# Patient Record
Sex: Male | Born: 1975 | Race: Black or African American | Hispanic: No | Marital: Single | Smoking: Current every day smoker
Health system: Southern US, Community
[De-identification: ages and names within clinical notes are randomized; demographics above are authoritative.]

## PROBLEM LIST (undated history)

## (undated) DIAGNOSIS — K859 Acute pancreatitis without necrosis or infection, unspecified: Secondary | ICD-10-CM

## (undated) DIAGNOSIS — K8591 Acute pancreatitis with uninfected necrosis, unspecified: Secondary | ICD-10-CM

## (undated) HISTORY — PX: NO PAST SURGERIES: SHX2092

---

## 1998-06-20 ENCOUNTER — Emergency Department (HOSPITAL_COMMUNITY): Admission: EM | Admit: 1998-06-20 | Discharge: 1998-06-20 | Payer: Self-pay | Admitting: Emergency Medicine

## 1998-06-20 ENCOUNTER — Encounter: Payer: Self-pay | Admitting: Emergency Medicine

## 2000-08-18 ENCOUNTER — Encounter: Payer: Self-pay | Admitting: *Deleted

## 2000-08-18 ENCOUNTER — Emergency Department (HOSPITAL_COMMUNITY): Admission: EM | Admit: 2000-08-18 | Discharge: 2000-08-18 | Payer: Self-pay | Admitting: Podiatry

## 2001-03-05 ENCOUNTER — Emergency Department (HOSPITAL_COMMUNITY): Admission: EM | Admit: 2001-03-05 | Discharge: 2001-03-05 | Payer: Self-pay

## 2001-03-07 ENCOUNTER — Emergency Department (HOSPITAL_COMMUNITY): Admission: EM | Admit: 2001-03-07 | Discharge: 2001-03-08 | Payer: Self-pay | Admitting: *Deleted

## 2001-03-08 ENCOUNTER — Emergency Department (HOSPITAL_COMMUNITY): Admission: EM | Admit: 2001-03-08 | Discharge: 2001-03-08 | Payer: Self-pay | Admitting: Emergency Medicine

## 2001-03-08 ENCOUNTER — Encounter: Payer: Self-pay | Admitting: Emergency Medicine

## 2001-03-30 ENCOUNTER — Encounter: Payer: Self-pay | Admitting: Emergency Medicine

## 2001-03-30 ENCOUNTER — Emergency Department (HOSPITAL_COMMUNITY): Admission: EM | Admit: 2001-03-30 | Discharge: 2001-03-30 | Payer: Self-pay | Admitting: Emergency Medicine

## 2002-01-04 ENCOUNTER — Emergency Department (HOSPITAL_COMMUNITY): Admission: EM | Admit: 2002-01-04 | Discharge: 2002-01-04 | Payer: Self-pay | Admitting: Emergency Medicine

## 2003-08-01 ENCOUNTER — Emergency Department (HOSPITAL_COMMUNITY): Admission: EM | Admit: 2003-08-01 | Discharge: 2003-08-02 | Payer: Self-pay | Admitting: *Deleted

## 2009-10-09 ENCOUNTER — Emergency Department (HOSPITAL_COMMUNITY): Admission: EM | Admit: 2009-10-09 | Discharge: 2009-10-09 | Payer: Self-pay | Admitting: Emergency Medicine

## 2010-10-13 ENCOUNTER — Inpatient Hospital Stay (INDEPENDENT_AMBULATORY_CARE_PROVIDER_SITE_OTHER)
Admission: RE | Admit: 2010-10-13 | Discharge: 2010-10-13 | Disposition: A | Discharge status: Home / Self Care | Payer: Self-pay | Source: Ambulatory Visit | Attending: Family Medicine | Admitting: Family Medicine

## 2010-10-13 DIAGNOSIS — L0201 Cutaneous abscess of face: Secondary | ICD-10-CM

## 2010-10-16 LAB — CULTURE, ROUTINE-ABSCESS

## 2011-09-08 ENCOUNTER — Emergency Department (HOSPITAL_COMMUNITY)
Admission: EM | Admit: 2011-09-08 | Discharge: 2011-09-08 | Disposition: A | Discharge status: Home Health | Payer: Self-pay | Source: Home / Self Care | Attending: Emergency Medicine | Admitting: Emergency Medicine

## 2011-09-08 ENCOUNTER — Encounter (HOSPITAL_COMMUNITY): Payer: Self-pay | Admitting: *Deleted

## 2011-09-08 DIAGNOSIS — G43909 Migraine, unspecified, not intractable, without status migrainosus: Secondary | ICD-10-CM

## 2011-09-08 MED ORDER — DEXAMETHASONE SODIUM PHOSPHATE 10 MG/ML IJ SOLN
10.0000 mg | Freq: Once | INTRAMUSCULAR | Status: AC
  Administered 2011-09-08: 10 mg via INTRAMUSCULAR

## 2011-09-08 MED ORDER — PREDNISONE 5 MG PO KIT: 1.0000 | PACK | Freq: Every day | ORAL | Status: DC

## 2011-09-08 MED ORDER — KETOROLAC TROMETHAMINE 60 MG/2ML IM SOLN
60.0000 mg | Freq: Once | INTRAMUSCULAR | Status: AC
  Administered 2011-09-08: 60 mg via INTRAMUSCULAR

## 2011-09-08 MED ORDER — KETOROLAC TROMETHAMINE 60 MG/2ML IM SOLN
INTRAMUSCULAR | Status: AC
  Filled 2011-09-08: qty 2

## 2011-09-08 MED ORDER — DEXAMETHASONE SODIUM PHOSPHATE 10 MG/ML IJ SOLN
INTRAMUSCULAR | Status: AC
  Filled 2011-09-08: qty 1

## 2011-09-08 MED ORDER — SUMATRIPTAN SUCCINATE 50 MG PO TABS: 50.0000 mg | ORAL_TABLET | ORAL | Status: DC | PRN

## 2011-09-08 NOTE — ED Provider Notes (Signed)
Chief Complaint  Patient presents with  . Headache    History of Present Illness:   The patient is a 36 year old male had a three-day history of a severe, right-sided, throbbing headache. Yesterday the pain was a 10 over 10 in intensity, and today is down to a 5/10. He has not had any nausea with this but does note photophobia, phonophobia, and flashes of light as a visual aura. He denies any blurred vision or diplopia. He has had a history of headaches in the past which are similar, but not quite as severe. He's never been formally diagnosed with migraines and never take any treatment for migraines. There is no obvious precipitating factor. He denies any paresthesias, numbness, or tingling. No muscle weakness, difficulty with speech, Allen's, coordination, swallowing, or ambulation. He's had no episodes of loss of consciousness or syncope. He denies any fever, chills, or stiff neck.  Review of Systems:  Other than noted above, the patient denies any of the following symptoms: Systemic:  No fever, chills, fatigue, photophobia, stiff neck. Eye:  No redness, eye pain, discharge, blurred vision, or diplopia. ENT:  No nasal congestion, rhinorrhea, sinus pressure or pain, sneezing, earache, or sore throat.  No jaw claudication. Neuro:  No paresthesias, loss of consciousness, seizure activity, muscle weakness, trouble with coordination or gait, trouble speaking or swallowing. Psych:  No depression, anxiety or trouble sleeping.  PMFSH:  Past medical history, family history, social history, meds, and allergies were reviewed.  Physical Exam:   Vital signs:  BP 141/70  Pulse 94  Temp(Src) 98.5 F (36.9 C) (Oral)  Resp 16  SpO2 100% General:  Alert and oriented.  In no distress. Eye:  Lids and conjunctivas normal.  PERRL,  Full EOMs.  Fundi benign with normal discs and vessels. ENT:  No cranial or facial tenderness to palpation.  TMs and canals clear.  Nasal mucosa was normal and uncongested without  any drainage. No intra oral lesions, pharynx clear, mucous membranes moist, dentition normal. Neck:  Supple, full ROM, no tenderness to palpation.  No adenopathy or mass. Neuro:  Alert and orented times 3.  Speech was clear, fluent, and appropriate.  Cranial nerves intact. No pronator drift, muscle strength normal. Finger to nose normal.  DTRs 2+ .Station and gait were normal.  Romberg's sign was normal.  Able to perform tandem gait well. Psych:  Normal affect.  Medications given in [facility]:  He was given Decadron 10 mg IM and Toradol 60 mg IM. He was told to go home and take Benadryl 50 mg.  Assessment:   Diagnoses that have been ruled out:  None  Diagnoses that are still under consideration:  None  Final diagnoses:  Migraine headache    Plan:   1.  The following meds were prescribed:   New Prescriptions   PREDNISONE 5 MG KIT    Take 1 kit (5 mg total) by mouth daily after breakfast. Prednisone 5 mg 6 day dosepack.  Take as directed.   SUMATRIPTAN (IMITREX) 50 MG TABLET    Take 1 tablet (50 mg total) by mouth every 2 (two) hours as needed for migraine.   2.  The patient was instructed in symptomatic care and handouts were given. 3.  The patient was told to return if becoming worse in any way, if no better in 3 or 4 days, and given some red flag symptoms that would indicate earlier return.  Follow up:  The patient was told to follow up with a primary care  physician within the next week, and if the headaches were getting better in 48 hours to return for recheck.     Reuben Likes, MD 09/08/11 [phone]

## 2011-09-08 NOTE — Discharge Instructions (Signed)
Take Benadryl 50 mg as soon as you get home.  If headache not better in 48 hours, go to emergency room.  Follow up with primary care in 1 week or 2 ([phone] for referral).Migraine Headache A migraine headache is an intense, throbbing pain on one or both sides of your head. The exact cause of a migraine headache is not always known. A migraine may be caused when nerves in the brain become irritated and release chemicals that cause swelling within blood vessels, causing pain. Many migraine sufferers have a family history of migraines. Before you get a migraine you may or may not get an aura. An aura is a group of symptoms that can predict the beginning of a migraine. An aura may include:  Visual changes such as:   Flashing lights.   Bright spots or zig-zag lines.   Tunnel vision.   Feelings of numbness.   Trouble talking.   Muscle weakness.  SYMPTOMS  Pain on one or both sides of your head.   Pain that is pulsating or throbbing in nature.   Pain that is severe enough to prevent daily activities.   Pain that is aggravated by any daily physical activity.   Nausea (feeling sick to your stomach), vomiting, or both.   Pain with exposure to bright lights, loud noises, or activity.   General sensitivity to bright lights or loud noises.  MIGRAINE TRIGGERS Examples of triggers of migraine headaches include:   Alcohol.   Smoking.   Stress.   It may be related to menses (male menstruation).   Aged cheeses.   Foods or drinks that contain nitrates, glutamate, aspartame, or tyramine.   Lack of sleep.   Chocolate.   Caffeine.   Hunger.   Medications such as nitroglycerine (used to treat chest pain), birth control pills, estrogen, and some blood pressure medications.  DIAGNOSIS  A migraine headache is often diagnosed based on:  Symptoms.   Physical examination.   A computerized X-ray scan (computed tomography, CT) of your head.  TREATMENT  Medications can help  prevent migraines if they are recurrent or should they become recurrent. Your caregiver can help you with a medication or treatment program that will be helpful to you.   Lying down in a dark, quiet room may be helpful.   Keeping a headache diary may help you find a trend as to what may be triggering your headaches.  SEEK IMMEDIATE MEDICAL CARE IF:   You have confusion, personality changes or seizures.   You have headaches that wake you from sleep.   You have an increased frequency in your headaches.   You have a stiff neck.   You have a loss of vision.   You have muscle weakness.   You start losing your balance or have trouble walking.   You feel faint or pass out.  MAKE SURE YOU:   Understand these instructions.   Will watch your condition.   Will get help right away if you are not doing well or get worse.  Document Released: 06/01/2005 Document Revised: 05/21/2011 Document Reviewed: 01/15/2009 [facility] Patient Information 2012 [place], [place].

## 2011-09-08 NOTE — ED Notes (Signed)
Pt reports a right frontal headache which started 3 days ago without N or V.  He is sensitive to the light.  He denies recent URI pr trauma

## 2012-01-11 ENCOUNTER — Emergency Department (HOSPITAL_COMMUNITY)
Admission: EM | Admit: 2012-01-11 | Discharge: 2012-01-11 | Disposition: A | Discharge status: Home / Self Care | Payer: PRIVATE HEALTH INSURANCE | Attending: Emergency Medicine | Admitting: Emergency Medicine

## 2012-01-11 ENCOUNTER — Encounter (HOSPITAL_COMMUNITY): Payer: Self-pay | Admitting: Emergency Medicine

## 2012-01-11 DIAGNOSIS — F17200 Nicotine dependence, unspecified, uncomplicated: Secondary | ICD-10-CM | POA: Insufficient documentation

## 2012-01-11 DIAGNOSIS — K029 Dental caries, unspecified: Secondary | ICD-10-CM | POA: Insufficient documentation

## 2012-01-11 DIAGNOSIS — K047 Periapical abscess without sinus: Secondary | ICD-10-CM | POA: Insufficient documentation

## 2012-01-11 MED ORDER — PENICILLIN V POTASSIUM 500 MG PO TABS: 500.0000 mg | ORAL_TABLET | Freq: Three times a day (TID) | ORAL | Status: AC

## 2012-01-11 MED ORDER — OXYCODONE-ACETAMINOPHEN 5-325 MG PO TABS
1.0000 | ORAL_TABLET | Freq: Once | ORAL | Status: AC
  Administered 2012-01-11: 1 via ORAL
  Filled 2012-01-11: qty 1

## 2012-01-11 MED ORDER — OXYCODONE-ACETAMINOPHEN 5-325 MG PO TABS: 1.0000 | ORAL_TABLET | Freq: Four times a day (QID) | ORAL | Status: AC | PRN

## 2012-01-11 NOTE — ED Provider Notes (Signed)
History     CSN: [id number]  Arrival date & time 01/11/12  1446   None     No chief complaint on file.   (Consider location/radiation/quality/duration/timing/severity/associated sxs/prior treatment) HPI  Patient presents to the emergency department with a dental abscess. Symptoms began a couple days ago. The patient has tried to alleviate pain with ibuprofen and tylenol.  Pain rated at a 10/10, characterized as throbbing in nature and located right upper molar. Patient denies fever, night sweats, chills, difficulty swallowing or opening mouth, SOB, nuchal rigidity or decreased ROM of neck.  Patient does not have a dentist and requests a resource guide at discharge.   No past medical history on file.  No past surgical history on file.  Family History  Problem Relation Age of Onset  . Diabetes Other     History  Substance Use Topics  . Smoking status: Current Everyday Smoker -- 0.5 packs/day for 15 years    Types: Cigarettes  . Smokeless tobacco: Not on file  . Alcohol Use: Yes     occasionally      Review of Systems   HEENT: denies blurry vision or change in hearing PULMONARY: Denies difficulty breathing and SOB CARDIAC: denies chest pain or heart palpitations MUSCULOSKELETAL:  denies being unable to ambulate ABDOMEN AL: denies abdominal pain GU: denies loss of bowel or urinary control NEURO: denies numbness and tingling in extremities SKIN: no new rashes PSYCH: patient denies anxiety or depression. NECK: Pt denies having neck pain     Allergies  Review of patient's allergies indicates no known allergies.  Home Medications   Current Outpatient Rx  Name Route Sig Dispense Refill  . OXYCODONE-ACETAMINOPHEN 5-325 MG PO TABS Oral Take 1 tablet by mouth every 6 (six) hours as needed for pain. 15 tablet 0  . PENICILLIN V POTASSIUM 500 MG PO TABS Oral Take 1 tablet (500 mg total) by mouth 3 (three) times daily. 30 tablet 0    There were no vitals taken for  this visit.  Physical Exam  Nursing note and vitals reviewed. Constitutional: He appears well-developed and well-nourished.  HENT:  Head: Normocephalic and atraumatic.  Mouth/Throat: Uvula is midline, oropharynx is clear and moist and mucous membranes are normal. Dental abscesses and dental caries present.         Large abscess to upper right molar  Eyes: Conjunctivae and EOM are normal. Pupils are equal, round, and reactive to light.  Neck: Normal range of motion. Neck supple.  Cardiovascular: Normal rate and regular rhythm.   Pulmonary/Chest: Effort normal and breath sounds normal.    ED Course  Procedures (including critical care time)  Labs Reviewed - No data to display No results found.   1. Dental abscess       MDM  INCISION AND DRAINAGE Performed by: Dorthula Matas Consent: Verbal consent obtained. Risks and benefits: risks, benefits and alternatives were discussed Type: abscess  Body area: right upper molar  Anesthesia: topical  safe for mucosa   Anesthetic total: 2 seconds  Complexity: complex Blunt dissection to break up loculations  Drainage: purulent  Drainage amount: 2 mL's expressed with 18 gauge needle and syringe   Patient tolerance: Patient tolerated the procedure well with no immediate complications.  Pt given Rx for Percocets 5-325 (10 tabs) and Penicillin. Patient informed that they need to find a dentist and have the tooth pulled or the symptoms may be reoccurring. A Resource guide has been given with dental providers. Patient has been given return  to ED precautions.            Dorthula Matas, PA 01/11/12 1627

## 2012-01-11 NOTE — ED Notes (Signed)
Pain in r/upper side of mouth. Possible absess

## 2012-01-13 NOTE — ED Provider Notes (Signed)
Medical screening examination/treatment/procedure(s) were performed by non-physician practitioner and as supervising physician I was immediately available for consultation/collaboration.  Dinorah Masullo, MD 01/13/12 0010 

## 2012-06-29 ENCOUNTER — Emergency Department (HOSPITAL_COMMUNITY)
Admission: EM | Admit: 2012-06-29 | Discharge: 2012-06-29 | Disposition: A | Discharge status: Home / Self Care | Payer: PRIVATE HEALTH INSURANCE | Attending: Emergency Medicine | Admitting: Emergency Medicine

## 2012-06-29 ENCOUNTER — Encounter (HOSPITAL_COMMUNITY): Payer: Self-pay | Admitting: Emergency Medicine

## 2012-06-29 DIAGNOSIS — F17200 Nicotine dependence, unspecified, uncomplicated: Secondary | ICD-10-CM | POA: Insufficient documentation

## 2012-06-29 DIAGNOSIS — W540XXA Bitten by dog, initial encounter: Secondary | ICD-10-CM | POA: Insufficient documentation

## 2012-06-29 DIAGNOSIS — S71109A Unspecified open wound, unspecified thigh, initial encounter: Secondary | ICD-10-CM | POA: Insufficient documentation

## 2012-06-29 DIAGNOSIS — Z23 Encounter for immunization: Secondary | ICD-10-CM | POA: Insufficient documentation

## 2012-06-29 DIAGNOSIS — S71009A Unspecified open wound, unspecified hip, initial encounter: Secondary | ICD-10-CM | POA: Insufficient documentation

## 2012-06-29 DIAGNOSIS — Y929 Unspecified place or not applicable: Secondary | ICD-10-CM | POA: Insufficient documentation

## 2012-06-29 DIAGNOSIS — Y9389 Activity, other specified: Secondary | ICD-10-CM | POA: Insufficient documentation

## 2012-06-29 MED ORDER — TRAMADOL HCL 50 MG PO TABS: 50.0000 mg | ORAL_TABLET | Freq: Four times a day (QID) | ORAL | Status: DC | PRN

## 2012-06-29 MED ORDER — AMOXICILLIN-POT CLAVULANATE 875-125 MG PO TABS: 1.0000 | ORAL_TABLET | Freq: Two times a day (BID) | ORAL | Status: DC

## 2012-06-29 MED ORDER — TETANUS-DIPHTH-ACELL PERTUSSIS 5-2.5-18.5 LF-MCG/0.5 IM SUSP
0.5000 mL | Freq: Once | INTRAMUSCULAR | Status: AC
  Administered 2012-06-29: 0.5 mL via INTRAMUSCULAR
  Filled 2012-06-29: qty 0.5

## 2012-06-29 NOTE — ED Notes (Signed)
Bitten by dog rt thigh broke the skin  Last tetanus unkn shots for dr Elmo Putt also

## 2012-06-29 NOTE — ED Provider Notes (Signed)
History     CSN: [id number]  Arrival date & time 06/29/12  1322   First MD Initiated Contact with Patient 06/29/12 1354      No chief complaint on file.   (Consider location/radiation/quality/duration/timing/severity/associated sxs/prior treatment) HPI Comments: Patient is a 37 year old male who presents with a dog bite that occurred one hour ago. Patient reports the dog bit his right thigh. The dog is domestic and up to date on shots. Patient reports minimal, throbbing pain at the wound site. He reports associated bleeding which is now controlled. Palpation of the wound area makes the pain worse. Nothing makes the pain better. Patient did not try anything for symptoms.    History reviewed. No pertinent past medical history.  History reviewed. No pertinent past surgical history.  Family History  Problem Relation Age of Onset  . Diabetes Other     History  Substance Use Topics  . Smoking status: Current Every Day Smoker -- 0.5 packs/day for 15 years    Types: Cigarettes  . Smokeless tobacco: Not on file  . Alcohol Use: Yes     Comment: occasionally      Review of Systems  Skin: Positive for wound.  All other systems reviewed and are negative.    Allergies  Review of patient's allergies indicates no known allergies.  Home Medications  No current outpatient prescriptions on file.  BP 142/78  Pulse 113  Temp 98.2 F (36.8 C) (Oral)  Resp 18  SpO2 98%  Physical Exam  Nursing note and vitals reviewed. Constitutional: He is oriented to person, place, and time. He appears well-developed and well-nourished. No distress.  HENT:  Head: Normocephalic and atraumatic.  Eyes: Conjunctivae normal are normal.  Cardiovascular: Normal rate and regular rhythm.  Exam reveals no gallop and no friction rub.   No murmur heard. Pulmonary/Chest: Effort normal and breath sounds normal. He has no wheezes. He has no rales. He exhibits no tenderness.  Abdominal: Soft. There is no  tenderness.  Musculoskeletal: Normal range of motion.  Neurological: He is alert and oriented to person, place, and time. Coordination normal.       Speech is goal-oriented. Moves limbs without ataxia.   Skin: Skin is warm and dry.          Wound noted to right thigh. 2-1.5 cm wounds noted. Bleeding controlled. No surrounding erythema or foreign body noted.   Psychiatric: He has a normal mood and affect. His behavior is normal.    ED Course  Procedures (including critical care time)  Labs Reviewed - No data to display No results found.   1. Dog bite       MDM  2:18 PM Wound will be cleaned and bandaged. Wound will not be closed. I will discharge the patient after receiving a tetanus shot. He will have prescriptions for Augmentin and Vicodin. Vitals stable. No further evaluation needed at this time.       Emilia Beck, PA-C 06/29/12 1454

## 2012-06-29 NOTE — ED Provider Notes (Signed)
Medical screening examination/treatment/procedure(s) were performed by non-physician practitioner and as supervising physician I was immediately available for consultation/collaboration.   Glynn Octave, MD 06/29/12 [phone]

## 2012-06-29 NOTE — ED Notes (Signed)
Spoke with patient's uncle, Margy Clarks. The dog's vaccine's are UTD.

## 2012-06-29 NOTE — ED Notes (Signed)
Pt was bitten by brother's dog today. Deep bite wounds on right thigh. Called has been placed to inquire about the dog's vaccine status.

## 2014-07-15 ENCOUNTER — Encounter (HOSPITAL_COMMUNITY): Payer: Self-pay | Admitting: Emergency Medicine

## 2014-07-15 ENCOUNTER — Emergency Department (HOSPITAL_COMMUNITY): Payer: No Typology Code available for payment source

## 2014-07-15 ENCOUNTER — Emergency Department (HOSPITAL_COMMUNITY)
Admission: EM | Admit: 2014-07-15 | Discharge: 2014-07-15 | Disposition: A | Discharge status: Home / Self Care | Payer: No Typology Code available for payment source | Attending: Emergency Medicine | Admitting: Emergency Medicine

## 2014-07-15 DIAGNOSIS — Z720 Tobacco use: Secondary | ICD-10-CM | POA: Diagnosis not present

## 2014-07-15 DIAGNOSIS — N433 Hydrocele, unspecified: Secondary | ICD-10-CM | POA: Insufficient documentation

## 2014-07-15 DIAGNOSIS — IMO0002 Reserved for concepts with insufficient information to code with codable children: Secondary | ICD-10-CM

## 2014-07-15 DIAGNOSIS — N453 Epididymo-orchitis: Secondary | ICD-10-CM | POA: Insufficient documentation

## 2014-07-15 DIAGNOSIS — N508 Other specified disorders of male genital organs: Secondary | ICD-10-CM | POA: Diagnosis present

## 2014-07-15 LAB — URINE MICROSCOPIC-ADD ON

## 2014-07-15 LAB — URINALYSIS, ROUTINE W REFLEX MICROSCOPIC
Bilirubin Urine: NEGATIVE
GLUCOSE, UA: NEGATIVE mg/dL
Ketones, ur: 15 mg/dL — AB
NITRITE: NEGATIVE
Protein, ur: NEGATIVE mg/dL
Specific Gravity, Urine: 1.021 (ref 1.005–1.030)
Urobilinogen, UA: 0.2 mg/dL (ref 0.0–1.0)
pH: 5.5 (ref 5.0–8.0)

## 2014-07-15 MED ORDER — OXYCODONE-ACETAMINOPHEN 5-325 MG PO TABS: 1.0000 | ORAL_TABLET | Freq: Four times a day (QID) | ORAL | Status: DC | PRN

## 2014-07-15 MED ORDER — CEFTRIAXONE SODIUM 250 MG IJ SOLR
250.0000 mg | Freq: Once | INTRAMUSCULAR | Status: AC
  Administered 2014-07-15: 250 mg via INTRAMUSCULAR
  Filled 2014-07-15: qty 250

## 2014-07-15 MED ORDER — DOXYCYCLINE HYCLATE 100 MG PO CAPS: 100.0000 mg | ORAL_CAPSULE | Freq: Two times a day (BID) | ORAL | Status: DC

## 2014-07-15 MED ORDER — LIDOCAINE HCL (PF) 1 % IJ SOLN
INTRAMUSCULAR | Status: AC
  Administered 2014-07-15: 5 mL
  Filled 2014-07-15: qty 5

## 2014-07-15 MED ORDER — OXYCODONE-ACETAMINOPHEN 5-325 MG PO TABS
2.0000 | ORAL_TABLET | Freq: Once | ORAL | Status: AC
  Administered 2014-07-15: 2 via ORAL
  Filled 2014-07-15: qty 2

## 2014-07-15 NOTE — Discharge Instructions (Signed)
Your ultrasound today showed likely epididymo-orchitis. This is often caused by a sexually transmitted infection. You have been treated with Rocephin and must take doxycycline as prescribed outside of the hospital. Take this for the full course. Do not stop antibiotics early. Also recommend that you purchase a jockstrap to prevent worsening pain. Scrotal elevation is recommended as well as icing as needed. Follow-up with a urologist for recheck of symptoms. Do not engage in sexual intercourse until 1 week after completion of doxycycline. Return to the emergency department as needed if symptoms worsen.  Epididymitis Epididymitis is a swelling (inflammation) of the epididymis. The epididymis is a cord-like structure along the back part of the testicle. Epididymitis is usually, but not always, caused by infection. This is usually a sudden problem beginning with chills, fever and pain behind the scrotum and in the testicle. There may be swelling and redness of the testicle. DIAGNOSIS  Physical examination will reveal a tender, swollen epididymis. Sometimes, cultures are obtained from the urine or from prostate secretions to help find out if there is an infection or if the cause is a different problem. Sometimes, blood work is performed to see if your white blood cell count is elevated and if a germ (bacterial) or viral infection is present. Using this knowledge, an appropriate medicine which kills germs (antibiotic) can be chosen by your caregiver. A viral infection causing epididymitis will most often go away (resolve) without treatment. HOME CARE INSTRUCTIONS   Hot sitz baths for 20 minutes, 4 times per day, may help relieve pain.  Only take over-the-counter or prescription medicines for pain, discomfort or fever as directed by your caregiver.  Take all medicines, including antibiotics, as directed. Take the antibiotics for the full prescribed length of time even if you are feeling better.  It is very  important to keep all follow-up appointments. SEEK IMMEDIATE MEDICAL CARE IF:   You have a fever.  You have pain not relieved with medicines.  You have any worsening of your problems.  Your pain seems to come and go.  You develop pain, redness, and swelling in the scrotum and surrounding areas. MAKE SURE YOU:   Understand these instructions.  Will watch your condition.  Will get help right away if you are not doing well or get worse. Document Released: 05/29/2000 Document Revised: 08/24/2011 Document Reviewed: 04/18/2009 [facility] Patient Information 2015 [place], [place]. This information is not intended to replace advice given to you by your health care provider. Make sure you discuss any questions you have with your health care provider.  Hydrocele, Adult Fluid can collect around the testicles. This fluid forms in a sac. This condition is called a hydrocele. The collected fluid causes swelling of the scrotum. Usually, it affects just one testicle. Most of the time, the condition does not cause pain. Sometimes, the hydrocele goes away on its own. Other times, surgery is needed to get rid of the fluid. CAUSES A hydrocele does not develop often. Different things can cause a hydrocele in a man, including:  Injury to the scrotum.  Infection.  X-ray of the area around the scrotum.  A tumor or cancer of the testicle.  Twisting of a testicle.  Decreased blood flow to the scrotum. SYMPTOMS   Swelling without pain. The hydrocele feels like a water-filled balloon.  Swelling with pain. This can occur if the hydrocele was caused by infection or twisting.  Mild discomfort in the scrotum.  The hydrocele may feel heavy.  Swelling that gets smaller when  you lie down. DIAGNOSIS  Your caregiver will do a physical exam to decide if you have a hydrocele. This may include:  Asking questions about your overall health, today and in the past. Your caregiver may ask about any injuries,  X-rays, or infections.  Pushing on your abdomen or asking you to change positions to see if the size of the hydrocele changes.  Shining a light through the scrotum (transillumination) to see if the fluid inside the scrotum is clear.  Blood tests and urine tests to check for infection.  Imaging studies that take pictures of the scrotum and testicles. TREATMENT  Treatment depends in part on what caused the condition. Options include:  Watchful waiting. Your caregiver checks the hydrocele every so often.  Different surgeries to drain the fluid.  A needle may be put into the scrotum to drain fluid (needle aspiration). Fluid often returns after this type of treatment.  A cut (incision) may be made in the scrotum to remove the fluid sac (hydrocelectomy).  An incision may be made in the groin to repair a hydrocele that has contact with abdominal fluids (communicating hydrocele).  Medicines to treat an infection (antibiotics). HOME CARE INSTRUCTIONS  What you need to do at home may depend on the cause of the hydrocele and type of treatment. In general:  Take all medicine as directed by your caregiver. Follow the directions carefully.  Ask your caregiver if there is anything you should not do while you recover (activities, lifting, work, sex).  If you had surgery to repair a communicating hydrocele, recovery time may vary. Ask you caregiver about your recovery time.  Avoid heavy lifting for 4 to 6 weeks.  If you had an incision on the scrotum or groin, wash it for 2 to 3 days after surgery. Do this as long as the skin is closed and there are no gaps in the wound. Wash gently, and avoid rubbing the incision.  Keep all follow-up appointments. SEEK MEDICAL CARE IF:   Your scrotum seems to be getting larger.  The area becomes more and more uncomfortable. SEEK IMMEDIATE MEDICAL CARE IF:  You have a fever. Document Released: 11/19/2009 Document Revised: 03/22/2013 Document Reviewed:  11/19/2009 [facility] Patient Information 2015 [place]. This information is not intended to replace advice given to you by your health care provider. Make sure you discuss any questions you have with your health care provider.

## 2014-07-15 NOTE — ED Provider Notes (Signed)
CSN: [id number]     Arrival date & time 07/15/14  1749 History   First MD Initiated Contact with Patient 07/15/14 1806     Chief Complaint  Patient presents with  . testicle pain/swelling     (Consider location/radiation/quality/duration/timing/severity/associated sxs/prior Treatment) HPI Comments: Patient is a 39 year old male with no significant past medical history. He presents to the emergency department for worsening testicular pain over the past week. Symptoms associated with testicular swelling. He describes the pain as throbbing and reports it is worse when walking. Patient states he had one episode of hematuria this morning, but this resolved. He also noticed some redness to his left scrotal sac. Patient reports that he has been sexually active with 2 partners in the last 6 months. He denies the use of condoms when sexually active. He states he had similar symptoms 12 years ago, but is unable to remember what his diagnosis was. Patient denies associated fever, nausea, vomiting, abdominal pain, penile discharge, dysuria, and induration or purulent drainage from his scrotal sac.  The history is provided by the patient. No language interpreter was used.    History reviewed. No pertinent past medical history. History reviewed. No pertinent past surgical history. Family History  Problem Relation Age of Onset  . Diabetes Other    History  Substance Use Topics  . Smoking status: Current Every Day Smoker -- 0.50 packs/day for 15 years    Types: Cigarettes  . Smokeless tobacco: Not on file  . Alcohol Use: Yes     Comment: occasionally    Review of Systems  Constitutional: Negative for fever.  Genitourinary: Positive for hematuria, scrotal swelling and testicular pain. Negative for dysuria, penile swelling and penile pain.  All other systems reviewed and are negative.   Allergies  Review of patient's allergies indicates no known allergies.  Home Medications   Prior to Admission  medications   Medication Sig Start Date End Date Taking? Authorizing Provider  amoxicillin-clavulanate (AUGMENTIN) 875-125 MG per tablet Take 1 tablet by mouth every 12 (twelve) hours. Patient not taking: Reported on 07/15/2014 06/29/12   Emilia Beck, PA-C  traMADol (ULTRAM) 50 MG tablet Take 1 tablet (50 mg total) by mouth every 6 (six) hours as needed for pain. 06/29/12   Kaitlyn Szekalski, PA-C   BP 116/59 mmHg  Pulse 94  Temp(Src) 98.8 F (37.1 C) (Oral)  Resp 20  SpO2 98%   Physical Exam  Constitutional: He is oriented to person, place, and time. He appears well-developed and well-nourished. No distress.  Nontoxic/nonseptic appearing  HENT:  Head: Normocephalic and atraumatic.  Eyes: Conjunctivae and EOM are normal. No scleral icterus.  Neck: Normal range of motion.  Cardiovascular: Normal rate, regular rhythm and normal heart sounds.   Pulmonary/Chest: Effort normal and breath sounds normal. No respiratory distress. He has no wheezes. He has no rales.  Respirations even and unlabored  Abdominal: Soft. He exhibits no distension. There is no tenderness. There is no rebound and no guarding.  Soft, nontender. No peritoneal signs.  Genitourinary: Penis normal. Right testis shows no mass, no swelling and no tenderness. Right testis is descended. Left testis shows swelling and tenderness. Left testis is descended. Circumcised. No penile tenderness. No discharge found.  Chaperoned GU exam significant for scrotal swelling and mild redness without induration or evidence of abscess. No swelling, discharge, or lesions of penis.  Musculoskeletal: Normal range of motion.  Lymphadenopathy:       Right: No inguinal adenopathy present.  Left: Inguinal adenopathy present.  Neurological: He is alert and oriented to person, place, and time. He exhibits normal muscle tone. Coordination normal.  Skin: Skin is warm and dry. No rash noted. He is not diaphoretic. No erythema. No pallor.   Psychiatric: He has a normal mood and affect. His behavior is normal.  Nursing note and vitals reviewed.   ED Course  Procedures (including critical care time) Labs Review Labs Reviewed  URINALYSIS, ROUTINE W REFLEX MICROSCOPIC - Abnormal; Notable for the following:    APPearance CLOUDY (*)    Hgb urine dipstick MODERATE (*)    Ketones, ur 15 (*)    Leukocytes, UA LARGE (*)    All other components within normal limits  URINE MICROSCOPIC-ADD ON - Abnormal; Notable for the following:    Bacteria, UA FEW (*)    All other components within normal limits  GC/CHLAMYDIA PROBE AMP ([place])   Imaging Review [place] Scrotum  07/15/2014   CLINICAL DATA:  Bilateral scrotal pain  EXAM: SCROTAL ULTRASOUND  DOPPLER ULTRASOUND OF THE TESTICLES  TECHNIQUE: Complete ultrasound examination of the testicles, epididymis, and other scrotal structures was performed. Color and spectral Doppler ultrasound were also utilized to evaluate blood flow to the testicles.  COMPARISON:  None.  FINDINGS: Right testicle  Measurements: 4.0 x 1.9 x 2.5 cm. No mass visualized. There are scattered microcalcifications.  Left testicle  Measurements: 4.1 x 3.0 x 3.0 cm. No mass or microlithiasis visualized. The left testis is mildly hyperemic on color Doppler examination.  Right epididymis:  Normal in size and appearance.  Left epididymis: Left epididymis is hyperemic. No apparent mass. Left epididymis does not appear enlarged.  Hydrocele: No hydrocele on the right. There is a complex, multiseptated hydrocele on the left.  Varicocele:  None visualized.  Pulsed Doppler interrogation of both testes demonstrates low resistance arterial and venous waveforms bilaterally. The peak systolic velocity in the left testis is approximately 9 cm/sec. The peak systolic velocity in the right testis is approximately 5 cm/sec.  There is no scrotal abscess or scrotal wall thickening on either side.  IMPRESSION: Left testis and epididymis appear mildly  hyperemic. Suspect early left epididymo-orchitis.  No evidence of testicular torsion on either side. No intra testicular mass on either side.  Complex hydrocele on the left.  Suspect inflammatory etiology.  There are microcalcifications in the right testis. Current literature suggests that testicular microlithiasis is not a significant independent risk factor for development of testicular carcinoma, and that follow up imaging is not warranted in the absence of other risk factors. Monthly testicular self-examination and annual physical exams are considered appropriate surveillance. If patient has other risk factors for testicular carcinoma, then referral to Urology should be considered. (Reference: DeCastro, et al.: A 5-Year Follow up Study of Asymptomatic Men with Testicular Microlithiasis. J Urol 2008; 179:1420-1423.)   Electronically Signed   By: Bretta BangWilliam  Woodruff M.D.   On: 07/15/2014 19:45   [place] Art/ven Flow Abd Pelv Doppler  07/15/2014   CLINICAL DATA:  Bilateral scrotal pain  EXAM: SCROTAL ULTRASOUND  DOPPLER ULTRASOUND OF THE TESTICLES  TECHNIQUE: Complete ultrasound examination of the testicles, epididymis, and other scrotal structures was performed. Color and spectral Doppler ultrasound were also utilized to evaluate blood flow to the testicles.  COMPARISON:  None.  FINDINGS: Right testicle  Measurements: 4.0 x 1.9 x 2.5 cm. No mass visualized. There are scattered microcalcifications.  Left testicle  Measurements: 4.1 x 3.0 x 3.0 cm. No mass or microlithiasis visualized. The  left testis is mildly hyperemic on color Doppler examination.  Right epididymis:  Normal in size and appearance.  Left epididymis: Left epididymis is hyperemic. No apparent mass. Left epididymis does not appear enlarged.  Hydrocele: No hydrocele on the right. There is a complex, multiseptated hydrocele on the left.  Varicocele:  None visualized.  Pulsed Doppler interrogation of both testes demonstrates low resistance arterial and  venous waveforms bilaterally. The peak systolic velocity in the left testis is approximately 9 cm/sec. The peak systolic velocity in the right testis is approximately 5 cm/sec.  There is no scrotal abscess or scrotal wall thickening on either side.  IMPRESSION: Left testis and epididymis appear mildly hyperemic. Suspect early left epididymo-orchitis.  No evidence of testicular torsion on either side. No intra testicular mass on either side.  Complex hydrocele on the left.  Suspect inflammatory etiology.  There are microcalcifications in the right testis. Current literature suggests that testicular microlithiasis is not a significant independent risk factor for development of testicular carcinoma, and that follow up imaging is not warranted in the absence of other risk factors. Monthly testicular self-examination and annual physical exams are considered appropriate surveillance. If patient has other risk factors for testicular carcinoma, then referral to Urology should be considered. (Reference: DeCastro, et al.: A 5-Year Follow up Study of Asymptomatic Men with Testicular Microlithiasis. J Urol 2008; 179:1420-1423.)   Electronically Signed   By: Bretta Bang M.D.   On: 07/15/2014 19:45     EKG Interpretation None      MDM   Final diagnoses:  Epididymo-orchitis, acute  Hydrocele, left    39 year old male presents to the emergency department for scrotal swelling and testicular pain x1 week. Patient is afebrile and hemodynamically stable. His urinalysis shows too numerous to count white cells. Scrotal ultrasound performed which is consistent with likely epididymoorchitis without testicular torsion. There is a complex hydrocele on the left which is likely inflammatory.  Given history of multiple sexual partners without the use of condoms, patient treated with Rocephin in ED. He will be discharged with a course of doxycycline as well as urology referral. Scrotal elevation and icing advised. Percocet  prescribed for pain control as needed. Return precautions discussed and provided. Patient agreeable to plan with no unaddressed concerns. Patient discharged in good condition; VSS.   Filed Vitals:   07/15/14 1755 07/15/14 2009 07/15/14 2030 07/15/14 2058  BP: 132/77 116/59 120/66   Pulse: 123 94 78   Temp: 98.2 F (36.8 C) 98.8 F (37.1 C)  98.8 F (37.1 C)  TempSrc: Oral Oral  Oral  Resp: 18 20    SpO2: 100% 98% 96%      Antony Madura, PA-C 07/15/14 2110  Toy Baker, MD 07/18/14 [phone]

## 2014-07-15 NOTE — ED Notes (Signed)
Per pt, states  Left testicle pain ans swelling for 2 days

## 2014-07-15 NOTE — ED Notes (Signed)
Bed: WA14 Expected date:  Expected time:  Means of arrival:  Comments: Hold for triage 2 

## 2014-07-18 ENCOUNTER — Emergency Department (HOSPITAL_COMMUNITY)
Admission: EM | Admit: 2014-07-18 | Discharge: 2014-07-18 | Disposition: A | Discharge status: Home / Self Care | Payer: No Typology Code available for payment source | Attending: Emergency Medicine | Admitting: Emergency Medicine

## 2014-07-18 ENCOUNTER — Encounter (HOSPITAL_COMMUNITY): Payer: Self-pay | Admitting: *Deleted

## 2014-07-18 DIAGNOSIS — Z720 Tobacco use: Secondary | ICD-10-CM | POA: Diagnosis not present

## 2014-07-18 DIAGNOSIS — N451 Epididymitis: Secondary | ICD-10-CM | POA: Diagnosis not present

## 2014-07-18 DIAGNOSIS — Z79899 Other long term (current) drug therapy: Secondary | ICD-10-CM | POA: Diagnosis not present

## 2014-07-18 DIAGNOSIS — N508 Other specified disorders of male genital organs: Secondary | ICD-10-CM | POA: Diagnosis present

## 2014-07-18 DIAGNOSIS — Z792 Long term (current) use of antibiotics: Secondary | ICD-10-CM | POA: Diagnosis not present

## 2014-07-18 LAB — URINE CULTURE: Colony Count: >=100000

## 2014-07-18 MED ORDER — HYDROCODONE-ACETAMINOPHEN 5-325 MG PO TABS
1.0000 | ORAL_TABLET | Freq: Once | ORAL | Status: AC
  Administered 2014-07-18: 1 via ORAL
  Filled 2014-07-18: qty 1

## 2014-07-18 MED ORDER — LEVOFLOXACIN 750 MG PO TABS
750.0000 mg | ORAL_TABLET | Freq: Once | ORAL | Status: AC
  Administered 2014-07-18: 750 mg via ORAL
  Filled 2014-07-18: qty 1

## 2014-07-18 MED ORDER — OXYCODONE-ACETAMINOPHEN 5-325 MG PO TABS: 2.0000 | ORAL_TABLET | ORAL | Status: DC | PRN

## 2014-07-18 MED ORDER — LEVOFLOXACIN 500 MG PO TABS: 500.0000 mg | ORAL_TABLET | Freq: Every day | ORAL | Status: DC

## 2014-07-18 NOTE — Discharge Instructions (Signed)
Continue your doxycycline as prescribed until it is gone. New prescription Levaquin for 10 days. Supportive shorts Avoid activity  Epididymitis Epididymitis is a swelling (inflammation) of the epididymis. The epididymis is a cord-like structure along the back part of the testicle. Epididymitis is usually, but not always, caused by infection. This is usually a sudden problem beginning with chills, fever and pain behind the scrotum and in the testicle. There may be swelling and redness of the testicle. DIAGNOSIS  Physical examination will reveal a tender, swollen epididymis. Sometimes, cultures are obtained from the urine or from prostate secretions to help find out if there is an infection or if the cause is a different problem. Sometimes, blood work is performed to see if your white blood cell count is elevated and if a germ (bacterial) or viral infection is present. Using this knowledge, an appropriate medicine which kills germs (antibiotic) can be chosen by your caregiver. A viral infection causing epididymitis will most often go away (resolve) without treatment. HOME CARE INSTRUCTIONS   Hot sitz baths for 20 minutes, 4 times per day, may help relieve pain.  Only take over-the-counter or prescription medicines for pain, discomfort or fever as directed by your caregiver.  Take all medicines, including antibiotics, as directed. Take the antibiotics for the full prescribed length of time even if you are feeling better.  It is very important to keep all follow-up appointments. SEEK IMMEDIATE MEDICAL CARE IF:   You have a fever.  You have pain not relieved with medicines.  You have any worsening of your problems.  Your pain seems to come and go.  You develop pain, redness, and swelling in the scrotum and surrounding areas. MAKE SURE YOU:   Understand these instructions.  Will watch your condition.  Will get help right away if you are not doing well or get worse. Document Released:  05/29/2000 Document Revised: 08/24/2011 Document Reviewed: 04/18/2009 [facility] Patient Information 2015 [employer], [place]. This information is not intended to replace advice given to you by your health care provider. Make sure you discuss any questions you have with your health care provider.

## 2014-07-18 NOTE — ED Notes (Signed)
Pt states he was seen here Sunday for scrotal pain, given antibiotics and pain medication, states was given a note to have off Monday and Tuesday, states he has not followed up with urologist like said on paper, states the PA told him not to call them until he felt better, pt states he needs a doctors note to have more days off d/t still have discomfort, states the oxycodone is helping for pain but he is unable to go to work.

## 2014-07-18 NOTE — ED Provider Notes (Signed)
CSN: [id number]     Arrival date & time 07/18/14  0235 History   First MD Initiated Contact with Patient 07/18/14 [phone]     Chief Complaint  Patient presents with  . Testicle Pain      HPI  Patient presents for evaluation of testicle pain. Seen and evaluated 1-31 and diagnosed with epididymoorchitis. Had an ultrasound that did not show tumor, mass, or torsion. Given IM Rocephin and doxycycline. Underwent urine culture which is since showing growth of significant number CFU use of gram-negative rods. He presents stating that his pain continues. It has decreased. He is using less by mouth pain meds. States she's been asked to go to work and he still quite painful up and around. The swelling is improved but not resolved.  History reviewed. No pertinent past medical history. History reviewed. No pertinent past surgical history. Family History  Problem Relation Age of Onset  . Diabetes Other    History  Substance Use Topics  . Smoking status: Current Every Day Smoker -- 0.50 packs/day for 15 years    Types: Cigarettes  . Smokeless tobacco: Not on file  . Alcohol Use: Yes     Comment: occasionally    Review of Systems  Constitutional: Negative for fever, chills, diaphoresis, appetite change and fatigue.  HENT: Negative for mouth sores, sore throat and trouble swallowing.   Eyes: Negative for visual disturbance.  Respiratory: Negative for cough, chest tightness, shortness of breath and wheezing.   Cardiovascular: Negative for chest pain.  Gastrointestinal: Negative for nausea, vomiting, abdominal pain, diarrhea and abdominal distention.  Endocrine: Negative for polydipsia, polyphagia and polyuria.  Genitourinary: Positive for scrotal swelling and testicular pain. Negative for dysuria, frequency and hematuria.  Musculoskeletal: Negative for gait problem.  Skin: Negative for color change, pallor and rash.  Neurological: Negative for dizziness, syncope, light-headedness and headaches.    Hematological: Does not bruise/bleed easily.  Psychiatric/Behavioral: Negative for behavioral problems and confusion.      Allergies  Review of patient's allergies indicates no known allergies.  Home Medications   Prior to Admission medications   Medication Sig Start Date End Date Taking? Authorizing Provider  amoxicillin-clavulanate (AUGMENTIN) 875-125 MG per tablet Take 1 tablet by mouth every 12 (twelve) hours. Patient not taking: Reported on 07/15/2014 06/29/12   Emilia Beck, PA-C  doxycycline (VIBRAMYCIN) 100 MG capsule Take 1 capsule (100 mg total) by mouth 2 (two) times daily. 07/15/14   Antony Madura, PA-C  levofloxacin (LEVAQUIN) 500 MG tablet Take 1 tablet (500 mg total) by mouth daily. 07/18/14   Rolland Porter, MD  oxyCODONE-acetaminophen (PERCOCET/ROXICET) 5-325 MG per tablet Take 1-2 tablets by mouth every 6 (six) hours as needed for moderate pain or severe pain. 07/15/14   Antony Madura, PA-C  oxyCODONE-acetaminophen (PERCOCET/ROXICET) 5-325 MG per tablet Take 2 tablets by mouth every 4 (four) hours as needed. 07/18/14   Rolland Porter, MD  traMADol (ULTRAM) 50 MG tablet Take 1 tablet (50 mg total) by mouth every 6 (six) hours as needed for pain. 06/29/12   Kaitlyn Szekalski, PA-C   BP 141/81 mmHg  Pulse 107  Temp(Src) 97.7 F (36.5 C) (Oral)  Resp 18  SpO2 100% Physical Exam  Constitutional: He is oriented to person, place, and time. He appears well-developed and well-nourished. No distress.  HENT:  Head: Normocephalic.  Eyes: Conjunctivae are normal. Pupils are equal, round, and reactive to light. No scleral icterus.  Neck: Normal range of motion. Neck supple. No thyromegaly present.  Cardiovascular: Normal rate  and regular rhythm.  Exam reveals no gallop and no friction rub.   No murmur heard. Pulmonary/Chest: Effort normal and breath sounds normal. No respiratory distress. He has no wheezes. He has no rales.  Abdominal: Soft. Bowel sounds are normal. He exhibits no  distension. There is no tenderness. There is no rebound.  Genitourinary:     Musculoskeletal: Normal range of motion.  Neurological: He is alert and oriented to person, place, and time.  Skin: Skin is warm and dry. No rash noted.  Psychiatric: He has a normal mood and affect. His behavior is normal.    ED Course  Procedures (including critical care time) Labs Review Labs Reviewed - No data to display  Imaging Review No results found.   EKG Interpretation None      MDM   Final diagnoses:  Epididymitis    Patient states his testicle and swelling has gotten better but remains painful. No fever shakes chills. Is able urinate without difficulty.  I reviewed his evaluation from 1-31. His urine culture preliminary shows greater than 100,000 CFU's gram-negative rods.  He is given by mouth Levaquin here and will be on a ten-day course per current CDC recommendations for epididymitis secondary to presumed enteric organisms.  Was given IM Rocephin on day one. Continues on doxycycline now. Has follow-up appointment with [facility].    Rolland PorterMark Tennie Grussing, MD 07/18/14 [phone]

## 2014-07-19 ENCOUNTER — Telehealth: Payer: Self-pay | Admitting: *Deleted

## 2014-07-19 NOTE — ED Notes (Signed)
(+)  urine culture, No follow up required, [facility]

## 2021-09-16 ENCOUNTER — Other Ambulatory Visit: Payer: Self-pay

## 2021-09-16 ENCOUNTER — Emergency Department (HOSPITAL_COMMUNITY): Payer: 59

## 2021-09-16 ENCOUNTER — Encounter (HOSPITAL_COMMUNITY): Payer: Self-pay

## 2021-09-16 ENCOUNTER — Emergency Department (HOSPITAL_COMMUNITY)
Admission: EM | Admit: 2021-09-16 | Discharge: 2021-09-17 | Disposition: A | Discharge status: Home / Self Care | Payer: 59 | Attending: Student | Admitting: Student

## 2021-09-16 DIAGNOSIS — H81399 Other peripheral vertigo, unspecified ear: Secondary | ICD-10-CM

## 2021-09-16 DIAGNOSIS — R519 Headache, unspecified: Secondary | ICD-10-CM | POA: Diagnosis not present

## 2021-09-16 DIAGNOSIS — F17210 Nicotine dependence, cigarettes, uncomplicated: Secondary | ICD-10-CM | POA: Insufficient documentation

## 2021-09-16 DIAGNOSIS — R42 Dizziness and giddiness: Secondary | ICD-10-CM | POA: Diagnosis present

## 2021-09-16 LAB — COMPREHENSIVE METABOLIC PANEL
ALT: 49 U/L — ABNORMAL HIGH (ref 0–44)
AST: 60 U/L — ABNORMAL HIGH (ref 15–41)
Albumin: 4 g/dL (ref 3.5–5.0)
Alkaline Phosphatase: 86 U/L (ref 38–126)
Anion gap: 15 (ref 5–15)
BUN: 9 mg/dL (ref 6–20)
CO2: 19 mmol/L — ABNORMAL LOW (ref 22–32)
Calcium: 9.1 mg/dL (ref 8.9–10.3)
Chloride: 104 mmol/L (ref 98–111)
Creatinine, Ser: 0.93 mg/dL (ref 0.61–1.24)
GFR, Estimated: >60 mL/min (ref >60)
Glucose, Bld: 102 mg/dL — ABNORMAL HIGH (ref 70–99)
Potassium: 3.7 mmol/L (ref 3.5–5.1)
Sodium: 138 mmol/L (ref 135–145)
Total Bilirubin: 0.6 mg/dL (ref 0.3–1.2)
Total Protein: 8.4 g/dL — ABNORMAL HIGH (ref 6.5–8.1)

## 2021-09-16 LAB — CBC
HCT: 46.2 % (ref 39.0–52.0)
Hemoglobin: 15.6 g/dL (ref 13.0–17.0)
MCH: 32.8 pg (ref 26.0–34.0)
MCHC: 33.8 g/dL (ref 30.0–36.0)
MCV: 97.3 fL (ref 80.0–100.0)
Platelets: 152 10*3/uL (ref 150–400)
RBC: 4.75 MIL/uL (ref 4.22–5.81)
RDW: 14.1 % (ref 11.5–15.5)
WBC: 6.3 10*3/uL (ref 4.0–10.5)
nRBC: 0 % (ref 0.0–0.2)

## 2021-09-16 LAB — URINALYSIS, ROUTINE W REFLEX MICROSCOPIC
Bilirubin Urine: NEGATIVE
Glucose, UA: NEGATIVE mg/dL
Ketones, ur: 20 mg/dL — AB
Leukocytes,Ua: NEGATIVE
Nitrite: NEGATIVE
Protein, ur: NEGATIVE mg/dL
Specific Gravity, Urine: 1.021 (ref 1.005–1.030)
pH: 7 (ref 5.0–8.0)

## 2021-09-16 LAB — CBG MONITORING, ED: Glucose-Capillary: 93 mg/dL (ref 70–99)

## 2021-09-16 LAB — LIPASE, BLOOD: Lipase: 33 U/L (ref 11–51)

## 2021-09-16 MED ORDER — LACTATED RINGERS IV BOLUS
1000.0000 mL | Freq: Once | INTRAVENOUS | Status: AC
  Administered 2021-09-16: 1000 mL via INTRAVENOUS

## 2021-09-16 MED ORDER — PROCHLORPERAZINE EDISYLATE 10 MG/2ML IJ SOLN
10.0000 mg | Freq: Once | INTRAMUSCULAR | Status: AC
  Administered 2021-09-16: 10 mg via INTRAVENOUS
  Filled 2021-09-16: qty 2

## 2021-09-16 MED ORDER — LORAZEPAM 2 MG/ML IJ SOLN
1.0000 mg | Freq: Once | INTRAMUSCULAR | Status: AC
  Administered 2021-09-16: 1 mg via INTRAVENOUS
  Filled 2021-09-16: qty 1

## 2021-09-16 NOTE — ED Triage Notes (Signed)
Pt arrived via POV for dizziness and body achesx2 days. Pt denies any other sx. Pt is tachy and seems a little anxious. ?

## 2021-09-16 NOTE — ED Notes (Signed)
PT ambulated to bathroom with no noted difficulty  ?

## 2021-09-16 NOTE — ED Notes (Signed)
Pt transported to MRI 

## 2021-09-16 NOTE — ED Provider Notes (Signed)
?[facility] EMERGENCY DEPARTMENT ?Provider Note ? ?CSN: [id number] ?Arrival date & time: 09/16/21 1509 ? ?Chief Complaint(s) ?Dizziness ? ?HPI ?Evan West is a 46 y.o. male who presents emergency department for evaluation of dizziness.  Patient states that his symptoms began 2 days ago and he currently states he is unable to walk because he is feeling so dizzy.  He endorses an associated headache.  States that his dizziness is worse when moving his head side to side.  Dizziness is fatigable.  Denies nausea, vomiting, chest pain, shortness of breath or other systemic symptoms. ? ? ?Dizziness ? ?Past Medical History ?History reviewed. No pertinent past medical history. ?There are no problems to display for this patient. ? ?Home Medication(s) ?Prior to Admission medications   ?Medication Sig Start Date End Date Taking? Authorizing Provider  ?amoxicillin-clavulanate (AUGMENTIN) 875-125 MG per tablet Take 1 tablet by mouth every 12 (twelve) hours. ?Patient not taking: Reported on 07/15/2014 06/29/12   Emilia Beck, PA-C  ?doxycycline (VIBRAMYCIN) 100 MG capsule Take 1 capsule (100 mg total) by mouth 2 (two) times daily. 07/15/14   Antony Madura, PA-C  ?levofloxacin (LEVAQUIN) 500 MG tablet Take 1 tablet (500 mg total) by mouth daily. 07/18/14   Rolland Porter, MD  ?oxyCODONE-acetaminophen (PERCOCET/ROXICET) 5-325 MG per tablet Take 2 tablets by mouth every 4 (four) hours as needed. 07/18/14   Rolland Porter, MD  ?traMADol (ULTRAM) 50 MG tablet Take 1 tablet (50 mg total) by mouth every 6 (six) hours as needed for pain. 06/29/12   Emilia Beck, PA-C  ?                                                                                                                                  ?Past Surgical History ?History reviewed. No pertinent surgical history. ?Family History ?Family History  ?Problem Relation Age of Onset  ? Diabetes Other   ? ? ?Social History ?Social History  ? ?Tobacco Use  ? Smoking status:  Every Day  ?  Packs/day: 0.24  ?  Years: 15.00  ?  Pack years: 3.60  ?  Types: Cigarettes  ?Substance Use Topics  ? Alcohol use: Yes  ?  Comment: occasionally  ? Drug use: No  ? ?Allergies ?Patient has no known allergies. ? ?Review of Systems ?Review of Systems  ?Neurological:  Positive for dizziness.  ? ?Physical Exam ?Vital Signs  ?I have reviewed the triage vital signs ?BP 133/68   Pulse 93   Temp 98.8 ?F (37.1 ?C)   Resp 16   Ht 5\' 6"  (1.676 m)   Wt 86.2 kg   SpO2 93%   BMI 30.67 kg/m?  ? ?Physical Exam ?Vitals and nursing note reviewed.  ?Constitutional:   ?   General: He is not in acute distress. ?   Appearance: He is well-developed.  ?HENT:  ?   Head: Normocephalic and atraumatic.  ?Eyes:  ?   Conjunctiva/sclera: Conjunctivae  normal.  ?Cardiovascular:  ?   Rate and Rhythm: Normal rate and regular rhythm.  ?   Heart sounds: No murmur heard. ?Pulmonary:  ?   Effort: Pulmonary effort is normal. No respiratory distress.  ?   Breath sounds: Normal breath sounds.  ?Abdominal:  ?   Palpations: Abdomen is soft.  ?   Tenderness: There is no abdominal tenderness.  ?Musculoskeletal:     ?   General: No swelling.  ?   Cervical back: Neck supple.  ?Skin: ?   General: Skin is warm and dry.  ?   Capillary Refill: Capillary refill takes less than 2 seconds.  ?Neurological:  ?   Mental Status: He is alert.  ?   Cranial Nerves: No cranial nerve deficit.  ?   Sensory: No sensory deficit.  ?   Motor: No weakness.  ?Psychiatric:     ?   Mood and Affect: Mood normal.  ? ? ?ED Results and Treatments ?Labs ?(all labs ordered are listed, but only abnormal results are displayed) ?Labs Reviewed  ?URINALYSIS, ROUTINE W REFLEX MICROSCOPIC - Abnormal; Notable for the following components:  ?    Result Value  ? APPearance HAZY (*)   ? Hgb urine dipstick SMALL (*)   ? Ketones, ur 20 (*)   ? Bacteria, UA RARE (*)   ? All other components within normal limits  ?COMPREHENSIVE METABOLIC PANEL - Abnormal; Notable for the following  components:  ? CO2 19 (*)   ? Glucose, Bld 102 (*)   ? Total Protein 8.4 (*)   ? AST 60 (*)   ? ALT 49 (*)   ? All other components within normal limits  ?CBC  ?LIPASE, BLOOD  ?CBG MONITORING, ED  ?                                                                                                                       ? ?Radiology ?CT Head Wo Contrast ? ?Result Date: 09/16/2021 ?CLINICAL DATA:  Dizziness unable to stand.  Concern for stroke EXAM: CT HEAD WITHOUT CONTRAST TECHNIQUE: Contiguous axial images were obtained from the base of the skull through the vertex without intravenous contrast. RADIATION DOSE REDUCTION: This exam was performed according to the departmental dose-optimization program which includes automated exposure control, adjustment of the mA and/or kV according to patient size and/or use of iterative reconstruction technique. COMPARISON:  None. FINDINGS: Brain: No evidence of acute infarction, hemorrhage, hydrocephalus, extra-axial collection or mass lesion/mass effect. Vascular: No hyperdense vessel or unexpected calcification. Skull: Normal. Negative for fracture or focal lesion. Sinuses/Orbits: Visualized portions of the paranasal sinuses are predominantly clear. Orbits are grossly unremarkable. Other: Mastoid air cells are predominantly clear. IMPRESSION: No acute intracranial abnormality. Electronically Signed   By: Maudry Mayhew M.D.   On: 09/16/2021 17:57   ? ?Pertinent labs & imaging results that were available during my care of the patient were reviewed by me and considered in my medical decision making (see MDM for  details). ? ?Medications Ordered in ED ?Medications  ?lactated ringers bolus 1,000 mL (0 mLs Intravenous Stopped 09/16/21 2104)  ?prochlorperazine (COMPAZINE) injection 10 mg (10 mg Intravenous Given 09/16/21 1806)  ?LORazepam (ATIVAN) injection 1 mg (1 mg Intravenous Given 09/16/21 1806)  ?                                                               ?                                                                     ?Procedures ?Procedures ? ?(including critical care time) ? ?Medical Decision Making / ED Course ? ? ?This patient presents to the ED for concern of dizziness, this involves an extensive number of treatment options, and is a complaint that carries with it a high risk of complications and morbidity.  The differential diagnosis includes BPPV, labyrinthitis, M?ni?re's disease, central vertigo, CVA ? ?MDM: ?Patient seen emergency room for evaluation of dizziness.  Physical exam with reproducible dizziness on Dix-Hallpike, no vertical nystagmus or focal neurologic deficits.  Laboratory evaluation unremarkable.  CT head unremarkable.  Patient given Compazine and Ativan as well as lactated Ringer's on reevaluation, dizziness is improving but still persistent.  Thus MRI was ordered.  Patient then signed out to oncoming provider.  Please see provider signout for continuation of work-up.  If MRI negative, patient likely safe for discharge. ? ? ?Additional history obtained: ? ?-External records from outside source obtained and reviewed including: Chart review including previous notes, labs, imaging, consultation notes ? ? ?Lab Tests: ?-I ordered, reviewed, and interpreted labs.   ?The pertinent results include:   ?Labs Reviewed  ?URINALYSIS, ROUTINE W REFLEX MICROSCOPIC - Abnormal; Notable for the following components:  ?    Result Value  ? APPearance HAZY (*)   ? Hgb urine dipstick SMALL (*)   ? Ketones, ur 20 (*)   ? Bacteria, UA RARE (*)   ? All other components within normal limits  ?COMPREHENSIVE METABOLIC PANEL - Abnormal; Notable for the following components:  ? CO2 19 (*)   ? Glucose, Bld 102 (*)   ? Total Protein 8.4 (*)   ? AST 60 (*)   ? ALT 49 (*)   ? All other components within normal limits  ?CBC  ?LIPASE, BLOOD  ?CBG MONITORING, ED  ?  ? ? ?EKG  ? EKG Interpretation ? ?Date/Time:  Tuesday September 16 2021 15:57:38 EDT ?Ventricular Rate:  120 ?PR Interval:  128 ?QRS Duration: 80 ?QT  Interval:  308 ?QTC Calculation: 435 ?R Axis:   88 ?Text Interpretation: Sinus tachycardia Otherwise normal ECG No previous ECGs available Confirmed by Clair Bardwell ([id number]) on 09/16/2021 11:19:41 PM ?  ? ?  ? ? ?

## 2021-09-16 NOTE — ED Notes (Signed)
Pt ambulated independently to the restroom and back to bed. Pt reports he felt okay walking but did feel somewhat dizzy while standing at edge of bed  ?

## 2021-09-16 NOTE — ED Provider Triage Note (Signed)
Emergency Medicine Provider Triage Evaluation Note ? ?Evan West , a 46 y.o. male  was evaluated in triage.  Pt complains of severe dizziness for 2 days.  Patient notes that he has barely been able to walk because he feels so off balance and dizzy.  Endorses adequate water intake.  Patient also notes that around when his dizziness started, he developed pain on the left side of his abdomen as well.  He denies headache, nausea, vomiting and diarrhea.  He has no previous history of stroke or CVA. ? ?Review of Systems  ?Positive: Ataxia, dizziness, upper quadrant pain ?Negative:  ? ?Physical Exam  ?BP (!) 169/108 (BP Location: Left Arm)   Pulse (!) 129   Temp 98.8 ?F (37.1 ?C)   Resp (!) 22   Ht 5\' 6"  (1.676 m)   Wt 86.2 kg   SpO2 96%   BMI 30.67 kg/m?  ?Gen:   Awake, no distress   ?Resp:  Normal effort  ?MSK:   Moves extremities without difficulty  ?Other:  Tender to palpation in the left upper quadrant ?Speech is clear, able to follow commands ?CN III-XII intact ?Normal strength in upper and lower extremities bilaterally including dorsiflexion and plantar flexion, strong and equal grip strength ?Sensation normal to light and sharp touch ?Normal finger to nose and rapid alternating movements ?No pronator drift ? ?I ambulated patient and he was unable to walk a few steps without needing to catch himself on the wall ? ? ?Medical Decision Making  ?Medically screening exam initiated at 4:44 PM.  Appropriate orders placed.  Greggory Safranek was informed that the remainder of the evaluation will be completed by another provider, this initial triage assessment does not replace that evaluation, and the importance of remaining in the ED until their evaluation is complete. ? ?Ataxia concerning for cerebellar stroke, will order CT head ?  ?Evan West, Janell Quiet ?09/16/21 1647 ? ?

## 2021-09-16 NOTE — ED Notes (Signed)
Pt provided water and crackers for PO challenge  

## 2021-09-17 MED ORDER — MECLIZINE HCL 25 MG PO TABS: 25.0000 mg | ORAL_TABLET | Freq: Three times a day (TID) | ORAL | 0 refills | Status: DC | PRN

## 2021-09-17 NOTE — ED Provider Notes (Signed)
Received patient at signout.  Patient with positional vertigo.  Initial work-up unremarkable, patient pending MRI at time of signout.  MRI has been performed and there is no sign of stroke.  Symptoms most consistent with peripheral vertigo, will discharge with symptomatic treatment. ?  ?Orpah Greek, MD ?09/17/21 [phone] ? ?

## 2021-09-17 NOTE — ED Notes (Signed)
Patient verbalizes understanding of discharge instructions. Opportunity for questioning and answers were provided. Armband removed by staff, pt discharged from ED ambulatory.   

## 2022-06-11 ENCOUNTER — Emergency Department (HOSPITAL_COMMUNITY): Payer: No Typology Code available for payment source

## 2022-06-11 ENCOUNTER — Inpatient Hospital Stay (HOSPITAL_COMMUNITY)
Admission: EM | Admit: 2022-06-11 | Discharge: 2022-06-15 | DRG: 442 | Disposition: A | Discharge status: Home / Self Care | Payer: No Typology Code available for payment source | Attending: General Surgery | Admitting: General Surgery

## 2022-06-11 ENCOUNTER — Other Ambulatory Visit (HOSPITAL_COMMUNITY): Payer: Self-pay

## 2022-06-11 ENCOUNTER — Other Ambulatory Visit: Payer: Self-pay

## 2022-06-11 ENCOUNTER — Encounter (HOSPITAL_COMMUNITY): Payer: Self-pay | Admitting: General Surgery

## 2022-06-11 DIAGNOSIS — T07XXXA Unspecified multiple injuries, initial encounter: Secondary | ICD-10-CM | POA: Diagnosis present

## 2022-06-11 DIAGNOSIS — S61412A Laceration without foreign body of left hand, initial encounter: Secondary | ICD-10-CM

## 2022-06-11 DIAGNOSIS — S36113A Laceration of liver, unspecified degree, initial encounter: Principal | ICD-10-CM

## 2022-06-11 DIAGNOSIS — S0101XA Laceration without foreign body of scalp, initial encounter: Secondary | ICD-10-CM

## 2022-06-11 DIAGNOSIS — F17210 Nicotine dependence, cigarettes, uncomplicated: Secondary | ICD-10-CM | POA: Diagnosis present

## 2022-06-11 DIAGNOSIS — S36114A Minor laceration of liver, initial encounter: Principal | ICD-10-CM | POA: Diagnosis present

## 2022-06-11 DIAGNOSIS — D62 Acute posthemorrhagic anemia: Secondary | ICD-10-CM | POA: Diagnosis present

## 2022-06-11 DIAGNOSIS — E876 Hypokalemia: Secondary | ICD-10-CM | POA: Diagnosis present

## 2022-06-11 DIAGNOSIS — S61012A Laceration without foreign body of left thumb without damage to nail, initial encounter: Secondary | ICD-10-CM | POA: Diagnosis present

## 2022-06-11 DIAGNOSIS — Z23 Encounter for immunization: Secondary | ICD-10-CM

## 2022-06-11 DIAGNOSIS — S51812A Laceration without foreign body of left forearm, initial encounter: Secondary | ICD-10-CM | POA: Diagnosis present

## 2022-06-11 LAB — I-STAT CHEM 8, ED
BUN: 6 mg/dL (ref 6–20)
Calcium, Ion: 1.01 mmol/L — ABNORMAL LOW (ref 1.15–1.40)
Chloride: 100 mmol/L (ref 98–111)
Creatinine, Ser: 1.3 mg/dL — ABNORMAL HIGH (ref 0.61–1.24)
Glucose, Bld: 110 mg/dL — ABNORMAL HIGH (ref 70–99)
HCT: 40 % (ref 39.0–52.0)
Hemoglobin: 13.6 g/dL (ref 13.0–17.0)
Potassium: 3 mmol/L — ABNORMAL LOW (ref 3.5–5.1)
Sodium: 141 mmol/L (ref 135–145)
TCO2: 25 mmol/L (ref 22–32)

## 2022-06-11 LAB — COMPREHENSIVE METABOLIC PANEL
ALT: 55 U/L — ABNORMAL HIGH (ref 0–44)
AST: 41 U/L (ref 15–41)
Albumin: 3.1 g/dL — ABNORMAL LOW (ref 3.5–5.0)
Alkaline Phosphatase: 70 U/L (ref 38–126)
Anion gap: 11 (ref 5–15)
BUN: 7 mg/dL (ref 6–20)
CO2: 23 mmol/L (ref 22–32)
Calcium: 8.7 mg/dL — ABNORMAL LOW (ref 8.9–10.3)
Chloride: 103 mmol/L (ref 98–111)
Creatinine, Ser: 1.09 mg/dL (ref 0.61–1.24)
GFR, Estimated: >60 mL/min (ref >60)
Glucose, Bld: 115 mg/dL — ABNORMAL HIGH (ref 70–99)
Potassium: 3.3 mmol/L — ABNORMAL LOW (ref 3.5–5.1)
Sodium: 137 mmol/L (ref 135–145)
Total Bilirubin: 0.2 mg/dL — ABNORMAL LOW (ref 0.3–1.2)
Total Protein: 6.8 g/dL (ref 6.5–8.1)

## 2022-06-11 LAB — CBC
HCT: 39.6 % (ref 39.0–52.0)
Hemoglobin: 13.4 g/dL (ref 13.0–17.0)
MCH: 35 pg — ABNORMAL HIGH (ref 26.0–34.0)
MCHC: 33.8 g/dL (ref 30.0–36.0)
MCV: 103.4 fL — ABNORMAL HIGH (ref 80.0–100.0)
Platelets: 218 10*3/uL (ref 150–400)
RBC: 3.83 MIL/uL — ABNORMAL LOW (ref 4.22–5.81)
RDW: 13 % (ref 11.5–15.5)
WBC: 13.1 10*3/uL — ABNORMAL HIGH (ref 4.0–10.5)
nRBC: 0 % (ref 0.0–0.2)

## 2022-06-11 LAB — TYPE AND SCREEN
ABO/RH(D): O POS
Antibody Screen: NEGATIVE

## 2022-06-11 LAB — PROTIME-INR
INR: 1.1 (ref 0.8–1.2)
Prothrombin Time: 14 seconds (ref 11.4–15.2)

## 2022-06-11 LAB — ETHANOL: Alcohol, Ethyl (B): 207 mg/dL — ABNORMAL HIGH (ref <10)

## 2022-06-11 MED ORDER — SODIUM CHLORIDE 0.9 % IV SOLN: 250.0000 mL | INTRAVENOUS | Status: DC | PRN

## 2022-06-11 MED ORDER — MORPHINE SULFATE (PF) 2 MG/ML IV SOLN
INTRAVENOUS | Status: AC
  Administered 2022-06-12: 2 mg via INTRAVENOUS
  Filled 2022-06-11: qty 2

## 2022-06-11 MED ORDER — TETANUS-DIPHTH-ACELL PERTUSSIS 5-2.5-18.5 LF-MCG/0.5 IM SUSY
0.5000 mL | PREFILLED_SYRINGE | Freq: Once | INTRAMUSCULAR | Status: AC
  Administered 2022-06-11: 0.5 mL via INTRAMUSCULAR

## 2022-06-11 MED ORDER — DEXTROSE 5 % IV SOLN
INTRAVENOUS | Status: AC | PRN
  Administered 2022-06-11: 2 g via INTRAVENOUS

## 2022-06-11 MED ORDER — ENOXAPARIN SODIUM 30 MG/0.3ML IJ SOSY: 30.0000 mg | PREFILLED_SYRINGE | Freq: Two times a day (BID) | INTRAMUSCULAR | Status: DC

## 2022-06-11 MED ORDER — DIPHENHYDRAMINE HCL 25 MG PO CAPS
25.0000 mg | ORAL_CAPSULE | Freq: Four times a day (QID) | ORAL | Status: DC | PRN
  Administered 2022-06-12 – 2022-06-13 (×2): 25 mg via ORAL
  Filled 2022-06-11 (×2): qty 1

## 2022-06-11 MED ORDER — METHOCARBAMOL 1000 MG/10ML IJ SOLN: 500.0000 mg | Freq: Three times a day (TID) | INTRAVENOUS | Status: DC | PRN

## 2022-06-11 MED ORDER — ACETAMINOPHEN 325 MG PO TABS
650.0000 mg | ORAL_TABLET | ORAL | Status: DC | PRN
  Administered 2022-06-13: 650 mg via ORAL
  Filled 2022-06-11: qty 2

## 2022-06-11 MED ORDER — SODIUM CHLORIDE 0.9% FLUSH
3.0000 mL | Freq: Two times a day (BID) | INTRAVENOUS | Status: DC
  Administered 2022-06-11 – 2022-06-13 (×4): 3 mL via INTRAVENOUS

## 2022-06-11 MED ORDER — LIDOCAINE-EPINEPHRINE (PF) 2 %-1:200000 IJ SOLN
10.0000 mL | Freq: Once | INTRAMUSCULAR | Status: DC
  Filled 2022-06-11: qty 20

## 2022-06-11 MED ORDER — ONDANSETRON HCL 4 MG/2ML IJ SOLN: 4.0000 mg | Freq: Four times a day (QID) | INTRAMUSCULAR | Status: DC | PRN

## 2022-06-11 MED ORDER — FENTANYL CITRATE PF 50 MCG/ML IJ SOSY
PREFILLED_SYRINGE | INTRAMUSCULAR | Status: AC
  Administered 2022-06-11: 50 ug
  Filled 2022-06-11: qty 1

## 2022-06-11 MED ORDER — IOHEXOL 350 MG/ML SOLN
75.0000 mL | Freq: Once | INTRAVENOUS | Status: AC | PRN
  Administered 2022-06-11: 75 mL via INTRAVENOUS

## 2022-06-11 MED ORDER — ONDANSETRON 4 MG PO TBDP: 4.0000 mg | ORAL_TABLET | Freq: Four times a day (QID) | ORAL | Status: DC | PRN

## 2022-06-11 MED ORDER — CEFAZOLIN SODIUM-DEXTROSE 1-4 GM/50ML-% IV SOLN: 1.0000 g | Freq: Once | INTRAVENOUS | Status: DC

## 2022-06-11 MED ORDER — MORPHINE SULFATE (PF) 2 MG/ML IV SOLN
1.0000 mg | INTRAVENOUS | Status: DC | PRN
  Administered 2022-06-12 – 2022-06-13 (×3): 2 mg via INTRAVENOUS
  Filled 2022-06-11 (×5): qty 1

## 2022-06-11 MED ORDER — MORPHINE SULFATE (PF) 4 MG/ML IV SOLN: 4.0000 mg | Freq: Once | INTRAVENOUS | Status: DC

## 2022-06-11 MED ORDER — DOCUSATE SODIUM 100 MG PO CAPS
100.0000 mg | ORAL_CAPSULE | Freq: Two times a day (BID) | ORAL | Status: DC
  Administered 2022-06-12 – 2022-06-13 (×2): 100 mg via ORAL
  Filled 2022-06-11 (×3): qty 1

## 2022-06-11 MED ORDER — TRAMADOL HCL 50 MG PO TABS: 50.0000 mg | ORAL_TABLET | Freq: Four times a day (QID) | ORAL | Status: DC | PRN

## 2022-06-11 MED ORDER — SODIUM CHLORIDE 0.9% FLUSH: 3.0000 mL | INTRAVENOUS | Status: DC | PRN

## 2022-06-11 MED ORDER — MELATONIN 3 MG PO TABS
3.0000 mg | ORAL_TABLET | Freq: Every evening | ORAL | Status: DC | PRN
  Administered 2022-06-12 – 2022-06-13 (×2): 3 mg via ORAL
  Filled 2022-06-11 (×2): qty 1

## 2022-06-11 MED ORDER — METHOCARBAMOL 500 MG PO TABS
500.0000 mg | ORAL_TABLET | Freq: Three times a day (TID) | ORAL | Status: DC | PRN
  Administered 2022-06-12 – 2022-06-13 (×2): 500 mg via ORAL
  Filled 2022-06-11 (×2): qty 1

## 2022-06-11 MED ORDER — OXYCODONE HCL 5 MG PO TABS
5.0000 mg | ORAL_TABLET | ORAL | Status: DC | PRN
  Administered 2022-06-11 – 2022-06-13 (×3): 5 mg via ORAL
  Filled 2022-06-11 (×3): qty 1

## 2022-06-11 NOTE — ED Provider Notes (Signed)
.  Critical Care  Performed by: Franne Forts, DO Authorized by: Franne Forts, DO   Critical care provider statement:    Critical care time (minutes):  102   Critical care was necessary to treat or prevent imminent or life-threatening deterioration of the following conditions:  Trauma   Critical care was time spent personally by me on the following activities:  Development of treatment plan with patient or surrogate, discussions with consultants, evaluation of patient's response to treatment, examination of patient, ordering and review of laboratory studies, ordering and review of radiographic studies, ordering and performing treatments and interventions, pulse oximetry, re-evaluation of patient's condition and review of old charts   Care discussed with comment:  Trauma team     Franne Forts, DO 06/11/22 2150

## 2022-06-11 NOTE — TOC CAGE-AID Note (Signed)
Transition of Care [facility]) - CAGE-AID Screening   Patient Details  Name: Evan West MRN: [id number] Date of Birth: 01/03/1976  Transition of Care (TOC) CM/SW Contact:    Katha Hamming, RN Phone Number:[phone] 06/11/2022, 9:08 PM      CAGE-AID Screening:    Have You Ever Felt You Ought to Cut Down on Your Drinking or Drug Use?: No Have People Annoyed You By Critizing Your Drinking Or Drug Use?: No Have You Felt Bad Or Guilty About Your Drinking Or Drug Use?: No Have You Ever Had a Drink or Used Drugs First Thing In The Morning to Steady Your Nerves or to Get Rid of a Hangover?: No CAGE-AID Score: 0  Substance Abuse Education Offered: No
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
## 2022-06-11 NOTE — Progress Notes (Signed)
   06/11/22 1930  Clinical Encounter Type  Visited With Patient not available  Visit Type Initial  Referral From Nurse  Consult/Referral To Chaplain   CH responded to the page. The patient is being attended to by the medical team. There is no support person present. CH remains available for follow up support as needed. This note was prepared by Deneen Harts, M.Div..  For questions please contact by phone [phone].
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
## 2022-06-11 NOTE — ED Notes (Signed)
To CT
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
## 2022-06-12 ENCOUNTER — Observation Stay (HOSPITAL_COMMUNITY): Payer: No Typology Code available for payment source

## 2022-06-12 DIAGNOSIS — S36113A Laceration of liver, unspecified degree, initial encounter: Secondary | ICD-10-CM | POA: Diagnosis present

## 2022-06-12 DIAGNOSIS — S61012A Laceration without foreign body of left thumb without damage to nail, initial encounter: Secondary | ICD-10-CM | POA: Diagnosis present

## 2022-06-12 DIAGNOSIS — S51812A Laceration without foreign body of left forearm, initial encounter: Secondary | ICD-10-CM | POA: Diagnosis present

## 2022-06-12 DIAGNOSIS — F17210 Nicotine dependence, cigarettes, uncomplicated: Secondary | ICD-10-CM | POA: Diagnosis present

## 2022-06-12 DIAGNOSIS — S0101XA Laceration without foreign body of scalp, initial encounter: Secondary | ICD-10-CM | POA: Diagnosis present

## 2022-06-12 DIAGNOSIS — Z23 Encounter for immunization: Secondary | ICD-10-CM | POA: Diagnosis not present

## 2022-06-12 DIAGNOSIS — E876 Hypokalemia: Secondary | ICD-10-CM | POA: Diagnosis present

## 2022-06-12 DIAGNOSIS — D62 Acute posthemorrhagic anemia: Secondary | ICD-10-CM | POA: Diagnosis present

## 2022-06-12 DIAGNOSIS — S36114A Minor laceration of liver, initial encounter: Secondary | ICD-10-CM | POA: Diagnosis present

## 2022-06-12 LAB — CBC
HCT: 29.4 % — ABNORMAL LOW (ref 39.0–52.0)
HCT: 28.8 % — ABNORMAL LOW (ref 39.0–52.0)
HCT: 25.3 % — ABNORMAL LOW (ref 39.0–52.0)
Hemoglobin: 9.6 g/dL — ABNORMAL LOW (ref 13.0–17.0)
Hemoglobin: 9.8 g/dL — ABNORMAL LOW (ref 13.0–17.0)
Hemoglobin: 8.4 g/dL — ABNORMAL LOW (ref 13.0–17.0)
MCH: 33.9 pg (ref 26.0–34.0)
MCH: 34.9 pg — ABNORMAL HIGH (ref 26.0–34.0)
MCH: 34.7 pg — ABNORMAL HIGH (ref 26.0–34.0)
MCHC: 32.7 g/dL (ref 30.0–36.0)
MCHC: 34 g/dL (ref 30.0–36.0)
MCHC: 33.2 g/dL (ref 30.0–36.0)
MCV: 103.9 fL — ABNORMAL HIGH (ref 80.0–100.0)
MCV: 102.5 fL — ABNORMAL HIGH (ref 80.0–100.0)
MCV: 104.5 fL — ABNORMAL HIGH (ref 80.0–100.0)
Platelets: 8 10*3/uL — CL (ref 150–400)
Platelets: 202 10*3/uL (ref 150–400)
Platelets: 179 10*3/uL (ref 150–400)
RBC: 2.83 MIL/uL — ABNORMAL LOW (ref 4.22–5.81)
RBC: 2.81 MIL/uL — ABNORMAL LOW (ref 4.22–5.81)
RBC: 2.42 MIL/uL — ABNORMAL LOW (ref 4.22–5.81)
RDW: 13.1 % (ref 11.5–15.5)
RDW: 13.1 % (ref 11.5–15.5)
RDW: 13 % (ref 11.5–15.5)
WBC: 6.6 10*3/uL (ref 4.0–10.5)
WBC: 12 10*3/uL — ABNORMAL HIGH (ref 4.0–10.5)
WBC: 13.2 10*3/uL — ABNORMAL HIGH (ref 4.0–10.5)
nRBC: 0 % (ref 0.0–0.2)
nRBC: 0 % (ref 0.0–0.2)
nRBC: 0 % (ref 0.0–0.2)

## 2022-06-12 LAB — ABO/RH: ABO/RH(D): O POS

## 2022-06-12 LAB — URINALYSIS, ROUTINE W REFLEX MICROSCOPIC
Bilirubin Urine: NEGATIVE
Glucose, UA: NEGATIVE mg/dL
Hgb urine dipstick: NEGATIVE
Ketones, ur: NEGATIVE mg/dL
Leukocytes,Ua: NEGATIVE
Nitrite: NEGATIVE
Protein, ur: NEGATIVE mg/dL
Specific Gravity, Urine: >1.046 — ABNORMAL HIGH (ref 1.005–1.030)
pH: 5 (ref 5.0–8.0)

## 2022-06-12 LAB — BASIC METABOLIC PANEL
Anion gap: 13 (ref 5–15)
BUN: 6 mg/dL (ref 6–20)
CO2: 23 mmol/L (ref 22–32)
Calcium: 8.3 mg/dL — ABNORMAL LOW (ref 8.9–10.3)
Chloride: 102 mmol/L (ref 98–111)
Creatinine, Ser: 0.98 mg/dL (ref 0.61–1.24)
GFR, Estimated: >60 mL/min (ref >60)
Glucose, Bld: 128 mg/dL — ABNORMAL HIGH (ref 70–99)
Potassium: 3 mmol/L — ABNORMAL LOW (ref 3.5–5.1)
Sodium: 138 mmol/L (ref 135–145)

## 2022-06-12 LAB — HIV ANTIBODY (ROUTINE TESTING W REFLEX): HIV Screen 4th Generation wRfx: NONREACTIVE

## 2022-06-12 MED ORDER — IOHEXOL 350 MG/ML SOLN
75.0000 mL | Freq: Once | INTRAVENOUS | Status: AC | PRN
  Administered 2022-06-12: 75 mL via INTRAVENOUS

## 2022-06-12 MED ORDER — POTASSIUM CHLORIDE 10 MEQ/100ML IV SOLN
10.0000 meq | INTRAVENOUS | Status: AC
  Administered 2022-06-12 (×2): 10 meq via INTRAVENOUS
  Filled 2022-06-12 (×2): qty 100

## 2022-06-12 MED ORDER — KCL IN DEXTROSE-NACL 20-5-0.45 MEQ/L-%-% IV SOLN
INTRAVENOUS | Status: DC
  Filled 2022-06-12 (×4): qty 1000

## 2022-06-12 MED ORDER — POTASSIUM CHLORIDE 10 MEQ/100ML IV SOLN
10.0000 meq | INTRAVENOUS | Status: DC
  Administered 2022-06-12 (×2): 10 meq via INTRAVENOUS
  Filled 2022-06-12 (×2): qty 100
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
## 2022-06-12 NOTE — ED Notes (Signed)
MD Donell Beers made aware of repeat CBC results.

## 2022-06-12 NOTE — ED Notes (Signed)
PT's BP tends to get a little soft after Morphine but always bounces back and pt has not been symptomatic
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
## 2022-06-12 NOTE — ED Notes (Signed)
MD Byerly advised to redraw CBC.
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
## 2022-06-12 NOTE — ED Notes (Signed)
Patient transported to CT 

## 2022-06-13 ENCOUNTER — Inpatient Hospital Stay (HOSPITAL_COMMUNITY): Payer: No Typology Code available for payment source

## 2022-06-13 LAB — BASIC METABOLIC PANEL
Anion gap: 8 (ref 5–15)
BUN: <5 mg/dL — ABNORMAL LOW (ref 6–20)
CO2: 25 mmol/L (ref 22–32)
Calcium: 8 mg/dL — ABNORMAL LOW (ref 8.9–10.3)
Chloride: 104 mmol/L (ref 98–111)
Creatinine, Ser: 0.83 mg/dL (ref 0.61–1.24)
GFR, Estimated: >60 mL/min (ref >60)
Glucose, Bld: 113 mg/dL — ABNORMAL HIGH (ref 70–99)
Potassium: 3.6 mmol/L (ref 3.5–5.1)
Sodium: 137 mmol/L (ref 135–145)

## 2022-06-13 LAB — CBC
HCT: 22.6 % — ABNORMAL LOW (ref 39.0–52.0)
HCT: 23.5 % — ABNORMAL LOW (ref 39.0–52.0)
HCT: 26.2 % — ABNORMAL LOW (ref 39.0–52.0)
Hemoglobin: 7.7 g/dL — ABNORMAL LOW (ref 13.0–17.0)
Hemoglobin: 7.8 g/dL — ABNORMAL LOW (ref 13.0–17.0)
Hemoglobin: 8.8 g/dL — ABNORMAL LOW (ref 13.0–17.0)
MCH: 34.1 pg — ABNORMAL HIGH (ref 26.0–34.0)
MCH: 34.4 pg — ABNORMAL HIGH (ref 26.0–34.0)
MCH: 34.5 pg — ABNORMAL HIGH (ref 26.0–34.0)
MCHC: 33.2 g/dL (ref 30.0–36.0)
MCHC: 33.6 g/dL (ref 30.0–36.0)
MCHC: 34.1 g/dL (ref 30.0–36.0)
MCV: 100.9 fL — ABNORMAL HIGH (ref 80.0–100.0)
MCV: 102.6 fL — ABNORMAL HIGH (ref 80.0–100.0)
MCV: 102.7 fL — ABNORMAL HIGH (ref 80.0–100.0)
Platelets: 158 10*3/uL (ref 150–400)
Platelets: 163 10*3/uL (ref 150–400)
Platelets: 185 10*3/uL (ref 150–400)
RBC: 2.24 MIL/uL — ABNORMAL LOW (ref 4.22–5.81)
RBC: 2.29 MIL/uL — ABNORMAL LOW (ref 4.22–5.81)
RBC: 2.55 MIL/uL — ABNORMAL LOW (ref 4.22–5.81)
RDW: 12.9 % (ref 11.5–15.5)
RDW: 12.9 % (ref 11.5–15.5)
RDW: 13.1 % (ref 11.5–15.5)
WBC: 12.3 10*3/uL — ABNORMAL HIGH (ref 4.0–10.5)
WBC: 12.8 10*3/uL — ABNORMAL HIGH (ref 4.0–10.5)
WBC: 13.3 10*3/uL — ABNORMAL HIGH (ref 4.0–10.5)
nRBC: 0 % (ref 0.0–0.2)
nRBC: 0 % (ref 0.0–0.2)
nRBC: 0 % (ref 0.0–0.2)
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
## 2022-06-13 NOTE — Progress Notes (Signed)
..  Trauma Event Note    Reason for Call :  Dr. Freida Busman notified of new CBC results. No new orders.  Last imported Vital Signs BP 98/66 (BP Location: Right Arm)   Pulse 75   Temp 98.4 F (36.9 C) (Oral)   Resp 16   Ht 5\' 7"  (1.702 m)   Wt 195 lb (88.5 kg)   SpO2 98%   BMI 30.54 kg/m   Trending CBC Recent Labs    06/12/22 0515 06/12/22 1451 06/13/22 0013  WBC 12.0* 13.2* 12.3*  HGB 9.8* 8.4* 7.7*  HCT 28.8* 25.3* 22.6*  PLT 202 179 163    Trending Coag's Recent Labs    06/11/22 1949  INR 1.1    Trending BMET Recent Labs    06/11/22 1949 06/11/22 2003 06/12/22 0410  NA 137 141 138  K 3.3* 3.0* 3.0*  CL 103 100 102  CO2 23  --  23  BUN 7 6 6   CREATININE 1.09 1.30* 0.98  GLUCOSE 115* 110* 128*      Evan West  Trauma Response RN  Please call TRN at [phone] for further assistance.

## 2022-06-14 LAB — CBC
HCT: 23.4 % — ABNORMAL LOW (ref 39.0–52.0)
Hemoglobin: 8.1 g/dL — ABNORMAL LOW (ref 13.0–17.0)
MCH: 35.1 pg — ABNORMAL HIGH (ref 26.0–34.0)
MCHC: 34.6 g/dL (ref 30.0–36.0)
MCV: 101.3 fL — ABNORMAL HIGH (ref 80.0–100.0)
Platelets: 182 10*3/uL (ref 150–400)
RBC: 2.31 MIL/uL — ABNORMAL LOW (ref 4.22–5.81)
RDW: 12.9 % (ref 11.5–15.5)
WBC: 12.2 10*3/uL — ABNORMAL HIGH (ref 4.0–10.5)
nRBC: 0 % (ref 0.0–0.2)
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
## 2022-06-15 DIAGNOSIS — S36113A Laceration of liver, unspecified degree, initial encounter: Secondary | ICD-10-CM | POA: Diagnosis present

## 2022-06-15 DIAGNOSIS — S61012A Laceration without foreign body of left thumb without damage to nail, initial encounter: Secondary | ICD-10-CM | POA: Diagnosis not present

## 2022-06-15 DIAGNOSIS — S51812A Laceration without foreign body of left forearm, initial encounter: Secondary | ICD-10-CM | POA: Diagnosis not present

## 2022-06-15 DIAGNOSIS — F17210 Nicotine dependence, cigarettes, uncomplicated: Secondary | ICD-10-CM | POA: Diagnosis not present

## 2022-06-15 DIAGNOSIS — S0101XA Laceration without foreign body of scalp, initial encounter: Secondary | ICD-10-CM | POA: Diagnosis not present

## 2022-06-15 DIAGNOSIS — D62 Acute posthemorrhagic anemia: Secondary | ICD-10-CM | POA: Diagnosis not present

## 2022-06-15 DIAGNOSIS — Z23 Encounter for immunization: Secondary | ICD-10-CM | POA: Diagnosis not present

## 2022-06-15 DIAGNOSIS — E876 Hypokalemia: Secondary | ICD-10-CM | POA: Diagnosis not present

## 2022-06-15 DIAGNOSIS — S36114A Minor laceration of liver, initial encounter: Secondary | ICD-10-CM | POA: Diagnosis not present

## 2022-06-15 LAB — CBC
HCT: 23.5 % — ABNORMAL LOW (ref 39.0–52.0)
Hemoglobin: 8 g/dL — ABNORMAL LOW (ref 13.0–17.0)
MCH: 34.8 pg — ABNORMAL HIGH (ref 26.0–34.0)
MCHC: 34 g/dL (ref 30.0–36.0)
MCV: 102.2 fL — ABNORMAL HIGH (ref 80.0–100.0)
Platelets: 208 10*3/uL (ref 150–400)
RBC: 2.3 MIL/uL — ABNORMAL LOW (ref 4.22–5.81)
RDW: 13.2 % (ref 11.5–15.5)
WBC: 13.8 10*3/uL — ABNORMAL HIGH (ref 4.0–10.5)
nRBC: 0 % (ref 0.0–0.2)

## 2022-06-15 MED ORDER — DOCUSATE SODIUM 100 MG PO CAPS: 100.0000 mg | ORAL_CAPSULE | Freq: Two times a day (BID) | ORAL | 0 refills | Status: DC

## 2022-06-15 MED ORDER — ACETAMINOPHEN 325 MG PO TABS: 650.0000 mg | ORAL_TABLET | ORAL | Status: DC | PRN

## 2022-06-15 MED ORDER — OXYCODONE HCL 5 MG PO TABS: 5.0000 mg | ORAL_TABLET | ORAL | 0 refills | Status: DC | PRN

## 2022-06-15 NOTE — Progress Notes (Signed)
Discharge instructions reviewed with patient utilizing teach back method no questions at this time. Patient discharged with plans to stay with  his mother.
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
## 2022-06-16 ENCOUNTER — Encounter (HOSPITAL_COMMUNITY): Payer: Self-pay

## 2022-08-19 ENCOUNTER — Emergency Department (HOSPITAL_COMMUNITY)
Admission: EM | Admit: 2022-08-19 | Discharge: 2022-08-19 | Disposition: A | Discharge status: Home / Self Care | Payer: 59 | Attending: Emergency Medicine | Admitting: Emergency Medicine

## 2022-08-19 ENCOUNTER — Emergency Department (HOSPITAL_COMMUNITY): Payer: 59

## 2022-08-19 ENCOUNTER — Encounter (HOSPITAL_COMMUNITY): Payer: Self-pay

## 2022-08-19 ENCOUNTER — Other Ambulatory Visit: Payer: Self-pay

## 2022-08-19 DIAGNOSIS — I2510 Atherosclerotic heart disease of native coronary artery without angina pectoris: Secondary | ICD-10-CM | POA: Insufficient documentation

## 2022-08-19 DIAGNOSIS — I10 Essential (primary) hypertension: Secondary | ICD-10-CM | POA: Insufficient documentation

## 2022-08-19 DIAGNOSIS — K209 Esophagitis, unspecified without bleeding: Secondary | ICD-10-CM | POA: Insufficient documentation

## 2022-08-19 DIAGNOSIS — R079 Chest pain, unspecified: Secondary | ICD-10-CM | POA: Diagnosis not present

## 2022-08-19 DIAGNOSIS — R0789 Other chest pain: Secondary | ICD-10-CM

## 2022-08-19 DIAGNOSIS — R1013 Epigastric pain: Secondary | ICD-10-CM | POA: Diagnosis present

## 2022-08-19 DIAGNOSIS — K2100 Gastro-esophageal reflux disease with esophagitis, without bleeding: Secondary | ICD-10-CM | POA: Insufficient documentation

## 2022-08-19 DIAGNOSIS — Z79899 Other long term (current) drug therapy: Secondary | ICD-10-CM | POA: Diagnosis not present

## 2022-08-19 DIAGNOSIS — Z720 Tobacco use: Secondary | ICD-10-CM | POA: Insufficient documentation

## 2022-08-19 LAB — CBC WITH DIFFERENTIAL/PLATELET
Abs Immature Granulocytes: 0.03 10*3/uL (ref 0.00–0.07)
Basophils Absolute: 0.1 10*3/uL (ref 0.0–0.1)
Basophils Relative: 1 %
Eosinophils Absolute: 0.2 10*3/uL (ref 0.0–0.5)
Eosinophils Relative: 2 %
HCT: 47.7 % (ref 39.0–52.0)
Hemoglobin: 16.4 g/dL (ref 13.0–17.0)
Immature Granulocytes: 0 %
Lymphocytes Relative: 42 %
Lymphs Abs: 4.1 10*3/uL — ABNORMAL HIGH (ref 0.7–4.0)
MCH: 33 pg (ref 26.0–34.0)
MCHC: 34.4 g/dL (ref 30.0–36.0)
MCV: 96 fL (ref 80.0–100.0)
Monocytes Absolute: 0.8 10*3/uL (ref 0.1–1.0)
Monocytes Relative: 8 %
Neutro Abs: 4.7 10*3/uL (ref 1.7–7.7)
Neutrophils Relative %: 47 %
Platelets: 238 10*3/uL (ref 150–400)
RBC: 4.97 MIL/uL (ref 4.22–5.81)
RDW: 14.1 % (ref 11.5–15.5)
WBC: 9.8 10*3/uL (ref 4.0–10.5)
nRBC: 0 % (ref 0.0–0.2)

## 2022-08-19 LAB — COMPREHENSIVE METABOLIC PANEL
ALT: 30 U/L (ref 0–44)
AST: 28 U/L (ref 15–41)
Albumin: 3.9 g/dL (ref 3.5–5.0)
Alkaline Phosphatase: 104 U/L (ref 38–126)
Anion gap: 16 — ABNORMAL HIGH (ref 5–15)
BUN: 8 mg/dL (ref 6–20)
CO2: 21 mmol/L — ABNORMAL LOW (ref 22–32)
Calcium: 9 mg/dL (ref 8.9–10.3)
Chloride: 97 mmol/L — ABNORMAL LOW (ref 98–111)
Creatinine, Ser: 0.75 mg/dL (ref 0.61–1.24)
GFR, Estimated: >60 mL/min (ref >60)
Glucose, Bld: 102 mg/dL — ABNORMAL HIGH (ref 70–99)
Potassium: 3.7 mmol/L (ref 3.5–5.1)
Sodium: 134 mmol/L — ABNORMAL LOW (ref 135–145)
Total Bilirubin: 0.8 mg/dL (ref 0.3–1.2)
Total Protein: 8.6 g/dL — ABNORMAL HIGH (ref 6.5–8.1)

## 2022-08-19 LAB — URINALYSIS, ROUTINE W REFLEX MICROSCOPIC
Bilirubin Urine: NEGATIVE
Glucose, UA: NEGATIVE mg/dL
Ketones, ur: NEGATIVE mg/dL
Leukocytes,Ua: NEGATIVE
Nitrite: NEGATIVE
Protein, ur: NEGATIVE mg/dL
Specific Gravity, Urine: 1.003 — ABNORMAL LOW (ref 1.005–1.030)
pH: 6 (ref 5.0–8.0)

## 2022-08-19 LAB — TROPONIN I (HIGH SENSITIVITY)
Troponin I (High Sensitivity): 14 ng/L (ref <18)
Troponin I (High Sensitivity): 15 ng/L (ref <18)

## 2022-08-19 LAB — LIPASE, BLOOD: Lipase: 33 U/L (ref 11–51)

## 2022-08-19 MED ORDER — PANTOPRAZOLE SODIUM 40 MG PO TBEC: 40.0000 mg | DELAYED_RELEASE_TABLET | Freq: Every day | ORAL | 0 refills | Status: DC

## 2022-08-19 MED ORDER — KETOROLAC TROMETHAMINE 15 MG/ML IJ SOLN
15.0000 mg | Freq: Once | INTRAMUSCULAR | Status: AC
Start: 2022-08-19 — End: 2022-08-19
  Administered 2022-08-19: 15 mg via INTRAVENOUS
  Filled 2022-08-19: qty 1

## 2022-08-19 MED ORDER — LIDOCAINE VISCOUS HCL 2 % MT SOLN
15.0000 mL | Freq: Once | OROMUCOSAL | Status: AC
  Administered 2022-08-19: 15 mL via ORAL
  Filled 2022-08-19: qty 15

## 2022-08-19 MED ORDER — ALUM & MAG HYDROXIDE-SIMETH 200-200-20 MG/5ML PO SUSP
30.0000 mL | Freq: Once | ORAL | Status: AC
Start: 2022-08-19 — End: 2022-08-19
  Administered 2022-08-19: 30 mL via ORAL
  Filled 2022-08-19: qty 30

## 2022-08-19 MED ORDER — IOHEXOL 350 MG/ML SOLN
75.0000 mL | Freq: Once | INTRAVENOUS | Status: AC | PRN
  Administered 2022-08-19: 75 mL via INTRAVENOUS

## 2022-08-19 MED ORDER — ACETAMINOPHEN 325 MG PO TABS
650.0000 mg | ORAL_TABLET | Freq: Once | ORAL | Status: AC
  Administered 2022-08-19: 650 mg via ORAL
  Filled 2022-08-19: qty 2

## 2022-08-19 NOTE — Discharge Instructions (Addendum)
Please use Maalox, Mylanta, which are over-the-counter, Protonix which I prescribed.  Please follow-up and establish a primary care doctor, if you are having some acid reflux with some irritation of your esophagus.  It is important to treat as over time it can cause more long-lasting damage, and chest pain.  I have attached some food choices to help with acid reflux.  If you have return of significant chest pain, shortness of breath, nausea, vomiting please return to the emergency department for further evaluation and management.
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
## 2022-08-19 NOTE — ED Notes (Signed)
Patient transported to CT 

## 2023-10-12 ENCOUNTER — Emergency Department (HOSPITAL_COMMUNITY)

## 2023-10-12 ENCOUNTER — Encounter (HOSPITAL_COMMUNITY): Payer: Self-pay

## 2023-10-12 ENCOUNTER — Other Ambulatory Visit: Payer: Self-pay

## 2023-10-12 ENCOUNTER — Inpatient Hospital Stay (HOSPITAL_COMMUNITY)
Admission: EM | Admit: 2023-10-12 | Discharge: 2023-10-16 | DRG: 439 | Disposition: A | Discharge status: Home / Self Care | Attending: Internal Medicine | Admitting: Internal Medicine

## 2023-10-12 DIAGNOSIS — J849 Interstitial pulmonary disease, unspecified: Secondary | ICD-10-CM | POA: Diagnosis present

## 2023-10-12 DIAGNOSIS — K8591 Acute pancreatitis with uninfected necrosis, unspecified: Secondary | ICD-10-CM | POA: Diagnosis not present

## 2023-10-12 DIAGNOSIS — K8521 Alcohol induced acute pancreatitis with uninfected necrosis: Principal | ICD-10-CM | POA: Diagnosis present

## 2023-10-12 DIAGNOSIS — Z79899 Other long term (current) drug therapy: Secondary | ICD-10-CM

## 2023-10-12 DIAGNOSIS — R9349 Abnormal radiologic findings on diagnostic imaging of other urinary organs: Secondary | ICD-10-CM | POA: Diagnosis present

## 2023-10-12 DIAGNOSIS — R935 Abnormal findings on diagnostic imaging of other abdominal regions, including retroperitoneum: Secondary | ICD-10-CM

## 2023-10-12 DIAGNOSIS — K700 Alcoholic fatty liver: Secondary | ICD-10-CM | POA: Diagnosis present

## 2023-10-12 DIAGNOSIS — F17210 Nicotine dependence, cigarettes, uncomplicated: Secondary | ICD-10-CM | POA: Diagnosis present

## 2023-10-12 DIAGNOSIS — F1010 Alcohol abuse, uncomplicated: Secondary | ICD-10-CM | POA: Diagnosis present

## 2023-10-12 DIAGNOSIS — K649 Unspecified hemorrhoids: Secondary | ICD-10-CM | POA: Diagnosis present

## 2023-10-12 DIAGNOSIS — R109 Unspecified abdominal pain: Secondary | ICD-10-CM | POA: Diagnosis not present

## 2023-10-12 DIAGNOSIS — K625 Hemorrhage of anus and rectum: Secondary | ICD-10-CM | POA: Diagnosis not present

## 2023-10-12 DIAGNOSIS — Z833 Family history of diabetes mellitus: Secondary | ICD-10-CM

## 2023-10-12 DIAGNOSIS — R000 Tachycardia, unspecified: Secondary | ICD-10-CM | POA: Diagnosis present

## 2023-10-12 LAB — CBC WITH DIFFERENTIAL/PLATELET
Abs Immature Granulocytes: 0.07 10*3/uL (ref 0.00–0.07)
Basophils Absolute: 0.1 10*3/uL (ref 0.0–0.1)
Basophils Relative: 1 %
Eosinophils Absolute: 0.3 10*3/uL (ref 0.0–0.5)
Eosinophils Relative: 3 %
HCT: 42.8 % (ref 39.0–52.0)
Hemoglobin: 14.2 g/dL (ref 13.0–17.0)
Immature Granulocytes: 1 %
Lymphocytes Relative: 23 %
Lymphs Abs: 3.1 10*3/uL (ref 0.7–4.0)
MCH: 32.8 pg (ref 26.0–34.0)
MCHC: 33.2 g/dL (ref 30.0–36.0)
MCV: 98.8 fL (ref 80.0–100.0)
Monocytes Absolute: 1.3 10*3/uL — ABNORMAL HIGH (ref 0.1–1.0)
Monocytes Relative: 9 %
Neutro Abs: 8.5 10*3/uL — ABNORMAL HIGH (ref 1.7–7.7)
Neutrophils Relative %: 63 %
Platelets: 174 10*3/uL (ref 150–400)
RBC: 4.33 MIL/uL (ref 4.22–5.81)
RDW: 14.1 % (ref 11.5–15.5)
WBC: 13.3 10*3/uL — ABNORMAL HIGH (ref 4.0–10.5)
nRBC: 0 % (ref 0.0–0.2)

## 2023-10-12 LAB — COMPREHENSIVE METABOLIC PANEL WITH GFR
ALT: 26 U/L (ref 0–44)
AST: 20 U/L (ref 15–41)
Albumin: 3.2 g/dL — ABNORMAL LOW (ref 3.5–5.0)
Alkaline Phosphatase: 66 U/L (ref 38–126)
Anion gap: 11 (ref 5–15)
BUN: 7 mg/dL (ref 6–20)
CO2: 24 mmol/L (ref 22–32)
Calcium: 8.9 mg/dL (ref 8.9–10.3)
Chloride: 100 mmol/L (ref 98–111)
Creatinine, Ser: 0.81 mg/dL (ref 0.61–1.24)
GFR, Estimated: >60 mL/min (ref >60)
Glucose, Bld: 105 mg/dL — ABNORMAL HIGH (ref 70–99)
Potassium: 4.1 mmol/L (ref 3.5–5.1)
Sodium: 135 mmol/L (ref 135–145)
Total Bilirubin: 1.6 mg/dL — ABNORMAL HIGH (ref 0.0–1.2)
Total Protein: 7.3 g/dL (ref 6.5–8.1)

## 2023-10-12 LAB — ETHANOL: Alcohol, Ethyl (B): <15 mg/dL (ref <15)

## 2023-10-12 LAB — LIPASE, BLOOD: Lipase: 51 U/L (ref 11–51)

## 2023-10-12 LAB — TRIGLYCERIDES: Triglycerides: 77 mg/dL (ref <150)

## 2023-10-12 MED ORDER — LACTATED RINGERS IV BOLUS
1000.0000 mL | Freq: Once | INTRAVENOUS | Status: AC
  Administered 2023-10-12: 1000 mL via INTRAVENOUS

## 2023-10-12 MED ORDER — IOHEXOL 350 MG/ML SOLN
75.0000 mL | Freq: Once | INTRAVENOUS | Status: AC | PRN
  Administered 2023-10-12: 75 mL via INTRAVENOUS

## 2023-10-12 MED ORDER — MORPHINE SULFATE (PF) 4 MG/ML IV SOLN: 4.0000 mg | Freq: Once | INTRAVENOUS | Status: DC

## 2023-10-12 MED ORDER — PIPERACILLIN-TAZOBACTAM 3.375 G IVPB: 3.3750 g | Freq: Once | INTRAVENOUS | Status: DC

## 2023-10-12 MED ORDER — HYDROMORPHONE HCL 1 MG/ML IJ SOLN
1.0000 mg | INTRAMUSCULAR | Status: DC | PRN
  Administered 2023-10-12 – 2023-10-13 (×2): 1 mg via INTRAVENOUS
  Filled 2023-10-12 (×2): qty 1

## 2023-10-12 MED ORDER — PIPERACILLIN-TAZOBACTAM 3.375 G IVPB: 3.3750 g | Freq: Three times a day (TID) | INTRAVENOUS | Status: DC

## 2023-10-12 MED ORDER — OXYCODONE HCL 5 MG PO TABS
5.0000 mg | ORAL_TABLET | ORAL | Status: DC | PRN
  Administered 2023-10-12 – 2023-10-13 (×2): 5 mg via ORAL
  Filled 2023-10-12 (×3): qty 1

## 2023-10-12 MED ORDER — ONDANSETRON HCL 4 MG/2ML IJ SOLN
4.0000 mg | Freq: Once | INTRAMUSCULAR | Status: AC
  Administered 2023-10-12: 4 mg via INTRAVENOUS
  Filled 2023-10-12: qty 2

## 2023-10-12 MED ORDER — FAMOTIDINE IN NACL 20-0.9 MG/50ML-% IV SOLN
20.0000 mg | Freq: Once | INTRAVENOUS | Status: AC
  Administered 2023-10-12: 20 mg via INTRAVENOUS
  Filled 2023-10-12: qty 50

## 2023-10-12 MED ORDER — ACETAMINOPHEN 325 MG PO TABS: 650.0000 mg | ORAL_TABLET | Freq: Four times a day (QID) | ORAL | Status: DC | PRN

## 2023-10-12 MED ORDER — PIPERACILLIN-TAZOBACTAM 3.375 G IVPB 30 MIN
3.3750 g | Freq: Once | INTRAVENOUS | Status: DC
  Administered 2023-10-12: 3.375 g via INTRAVENOUS
  Filled 2023-10-12: qty 50

## 2023-10-12 MED ORDER — ENOXAPARIN SODIUM 40 MG/0.4ML IJ SOSY
40.0000 mg | PREFILLED_SYRINGE | INTRAMUSCULAR | Status: DC
  Filled 2023-10-12: qty 0.4

## 2023-10-12 MED ORDER — FENTANYL CITRATE PF 50 MCG/ML IJ SOSY
50.0000 ug | PREFILLED_SYRINGE | Freq: Once | INTRAMUSCULAR | Status: AC
  Administered 2023-10-12: 50 ug via INTRAVENOUS
  Filled 2023-10-12: qty 1

## 2023-10-12 MED ORDER — SUCRALFATE 1 GM/10ML PO SUSP
1.0000 g | Freq: Once | ORAL | Status: AC
  Administered 2023-10-12: 1 g via ORAL
  Filled 2023-10-12: qty 10

## 2023-10-12 MED ORDER — SENNOSIDES-DOCUSATE SODIUM 8.6-50 MG PO TABS: 1.0000 | ORAL_TABLET | Freq: Every evening | ORAL | Status: DC | PRN

## 2023-10-12 MED ORDER — MORPHINE SULFATE (PF) 4 MG/ML IV SOLN: 4.0000 mg | INTRAVENOUS | Status: DC | PRN

## 2023-10-12 MED ORDER — LACTATED RINGERS IV SOLN: INTRAVENOUS | Status: AC

## 2023-10-12 MED ORDER — ONDANSETRON HCL 4 MG/2ML IJ SOLN: 4.0000 mg | Freq: Four times a day (QID) | INTRAMUSCULAR | Status: DC | PRN

## 2023-10-12 MED ORDER — LACTATED RINGERS IV SOLN: INTRAVENOUS | Status: DC

## 2023-10-12 MED ORDER — ACETAMINOPHEN 650 MG RE SUPP: 650.0000 mg | Freq: Four times a day (QID) | RECTAL | Status: DC | PRN

## 2023-10-12 NOTE — Consult Note (Signed)
 Referring Provider: EDP Primary Care Physician:  System, Provider Not In Primary Gastroenterologist:  None, unassigned  Reason for Consultation:  Necrotizing pancreatitis  HPI: Evan West is a 48 y.o. male with limited past medical history.  He presented to [facility] with complaints of abdominal pain and nausea/vomiting that began 3 weeks ago, have progressively worsened.  Abdominal pain began about 3 weeks ago, but got to the point that it became severe, intolerable.  Started with vomiting as well.  Able to tolerate liquids, but not tolerating solid food.  Came in for evaluation.  No history of similar symptoms in the past.  No history of pancreatitis.  He does admit that he drinks beer daily since probably about 48 years old.  He drinks varying amounts every day, sometimes up to 3 or 4 40-ounce beers per day.  Has not felt feverish.  Last bowel movement several days ago.  Says that sometimes he passes dark red blood from his bottom independent of the stool.  Says that he knows he has a hemorrhoid.  Lipase is normal.  White blood cell count is elevated 13.3 K.  Platelets and hemoglobin are normal.  LFTs normal except for a total bilirubin of 1.6.  Renal function electrolytes are normal.  Has received a liter of LR.  CT scan of the abdomen and pelvis with contrast:  IMPRESSION: 1. 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 There is an approximately 1.2 x 1.2 cm hypoattenuating area in the uncinate process region, which may represent acute necrotic collection. No other peripancreatic collection. Correlate clinically. Findings may represent late acute versus subacute pancreatitis. 2. There is a subtle ill-defined hypoattenuating area in the peripheral portion of the right prostatic mid gland/apex region, incompletely characterized on the current exam. Correlate with serum PSA level to determine the need for  additional imaging with MRI exam. 3. Multiple other nonacute observations, as described above. 4. 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    History reviewed. No pertinent past medical history.  History reviewed. No pertinent surgical history.  Prior to Admission medications   Medication Sig Start Date End Date Taking? Authorizing Provider  acetaminophen  (TYLENOL ) 325 MG tablet Take 2 tablets (650 mg total) by mouth every 4 (four) hours as needed for mild pain. 06/15/22   Kinsinger, Alphonso Aschoff, MD  amoxicillin -clavulanate (AUGMENTIN ) 875-125 MG per tablet Take 1 tablet by mouth every 12 (twelve) hours. Patient not taking: Reported on 07/15/2014 06/29/12   Szekalski, Kaitlyn, PA-C  docusate sodium  (COLACE) 100 MG capsule Take 1 capsule (100 mg total) by mouth 2 (two) times daily. 06/15/22   Kinsinger, Alphonso Aschoff, MD  doxycycline  (VIBRAMYCIN ) 100 MG capsule Take 1 capsule (100 mg total) by mouth 2 (two) times daily. 07/15/14   Carleton Cheek, PA-C  levofloxacin  (LEVAQUIN ) 500 MG tablet Take 1 tablet (500 mg total) by mouth daily. 07/18/14   Eino Gravel, MD  meclizine  (ANTIVERT ) 25 MG tablet Take 1 tablet (25 mg total) by mouth 3 (three) times daily as needed for dizziness. 09/17/21   Ballard Bongo, MD  oxyCODONE  (OXY IR/ROXICODONE ) 5 MG immediate release tablet Take 1 tablet (5 mg total) by mouth every 4 (four) hours as needed for moderate pain. 06/15/22   Kinsinger, Alphonso Aschoff, MD  oxyCODONE -acetaminophen  (PERCOCET/ROXICET) 5-325 MG per tablet Take 2 tablets by mouth every 4 (four) hours as needed.  07/18/14   Eino Gravel, MD  pantoprazole  (PROTONIX ) 40 MG tablet Take 1 tablet (40 mg total) by mouth daily. 08/19/22   Prosperi, Christian H, PA-C  traMADol  (ULTRAM ) 50 MG tablet Take 1 tablet (50 mg total) by mouth every 6 (six) hours as needed  for pain. 06/29/12   Szekalski, Kaitlyn, PA-C    No current facility-administered medications for this encounter.   Current Outpatient Medications  Medication Sig Dispense Refill   acetaminophen  (TYLENOL ) 325 MG tablet Take 2 tablets (650 mg total) by mouth every 4 (four) hours as needed for mild pain.     amoxicillin -clavulanate (AUGMENTIN ) 875-125 MG per tablet Take 1 tablet by mouth every 12 (twelve) hours. (Patient not taking: Reported on 07/15/2014) 14 tablet 0   docusate sodium  (COLACE) 100 MG capsule Take 1 capsule (100 mg total) by mouth 2 (two) times daily. 10 capsule 0   doxycycline  (VIBRAMYCIN ) 100 MG capsule Take 1 capsule (100 mg total) by mouth 2 (two) times daily. 20 capsule 0   levofloxacin  (LEVAQUIN ) 500 MG tablet Take 1 tablet (500 mg total) by mouth daily. 10 tablet 0   meclizine  (ANTIVERT ) 25 MG tablet Take 1 tablet (25 mg total) by mouth 3 (three) times daily as needed for dizziness. 30 tablet 0   oxyCODONE  (OXY IR/ROXICODONE ) 5 MG immediate release tablet Take 1 tablet (5 mg total) by mouth every 4 (four) hours as needed for moderate pain. 15 tablet 0   oxyCODONE -acetaminophen  (PERCOCET/ROXICET) 5-325 MG per tablet Take 2 tablets by mouth every 4 (four) hours as needed. 16 tablet 0   pantoprazole  (PROTONIX ) 40 MG tablet Take 1 tablet (40 mg total) by mouth daily. 30 tablet 0   traMADol  (ULTRAM ) 50 MG tablet Take 1 tablet (50 mg total) by mouth every 6 (six) hours as needed for pain. 6 tablet 0    Allergies as of 10/12/2023   (No Known Allergies)    Family History  Problem Relation Age of Onset   Diabetes Other     Social History   Socioeconomic History   Marital status: Single    Spouse name: Not on file   Number of children: Not on file   Years of education: Not on file   Highest education level: Not on file  Occupational History   Not on file  Tobacco Use   Smoking status: Every Day    Current packs/day: 0.24    Average packs/day: 0.2 packs/day for 15.0  years (3.6 ttl pk-yrs)    Types: Cigarettes   Smokeless tobacco: Not on file  Vaping Use   Vaping status: Never Used  Substance and Sexual Activity   Alcohol use: Yes    Comment: daily   Drug use: Never   Sexual activity: Not on file  Other Topics Concern   Not on file  Social History Narrative   ** Merged History Encounter **       Social Drivers of Corporate investment banker Strain: Not on file  Food Insecurity: Not on file  Transportation Needs: Not on file  Physical Activity: Not on file  Stress: Not on file  Social Connections: Not on file  Intimate Partner Violence: Not on file    Review of Systems: ROS is O/W negative except as mentioned in HPI.  Physical Exam: Vital signs in last 24 hours: Temp:  [98.3 F (36.8 C)-99 F (37.2 C)] 99 F (37.2 C) (04/29 1254) Pulse Rate:  [62-100] 66 (04/29 1400) Resp:  [11-22] 14 (04/29  1400) BP: (105-145)/(62-102) 123/77 (04/29 1400) SpO2:  [97 %-100 %] 100 % (04/29 1400) Weight:  [88.5 kg] 88.5 kg (04/29 0904)   General:  Alert, Well-developed, well-nourished, cooperative.  Appears uncomfortable at times but non-toxic appearing. Head:  Normocephalic and atraumatic. Eyes:  Sclera clear, no icterus.  Conjunctiva pink. Ears:  Normal auditory acuity. Mouth:  No deformity or lesions.   Lungs:  Clear throughout to auscultation.  No wheezes, crackles, or rhonchi.  Heart:  Regular rate and rhythm; no murmurs, clicks, rubs, or gallops. Abdomen:  Soft, non-distended.  BS present.  TTP in upper abdomen.  Msk:  Symmetrical without gross deformities. Extremities:  Without clubbing or edema. Neurologic:  Alert and oriented x 4;  grossly normal neurologically. Skin:  Intact without significant lesions or rashes.  Lab Results: Recent Labs    10/12/23 1035  WBC 13.3*  HGB 14.2  HCT 42.8  PLT 174   BMET Recent Labs    10/12/23 1035  NA 135  K 4.1  CL 100  CO2 24  GLUCOSE 105*  BUN 7  CREATININE 0.81  CALCIUM 8.9    LFT Recent Labs    10/12/23 1035  PROT 7.3  ALBUMIN 3.2*  AST 20  ALT 26  ALKPHOS 66  BILITOT 1.6*   Studies/Results: CT ABDOMEN PELVIS W CONTRAST Result Date: 10/12/2023 CLINICAL DATA:  Abdominal pain, acute, nonlocalized upper abd pain, worse w any motion. EXAM: CT ABDOMEN AND PELVIS WITH CONTRAST TECHNIQUE: Multidetector CT imaging of the abdomen and pelvis was performed using the standard protocol following bolus administration of intravenous contrast. RADIATION DOSE REDUCTION: This exam was performed according to the departmental dose-optimization program which includes automated exposure control, adjustment of the mA and/or kV according to patient size and/or use of iterative reconstruction technique. CONTRAST:  75mL OMNIPAQUE  IOHEXOL  350 MG/ML SOLN COMPARISON:  CT scan abdomen and pelvis from 08/19/2022. 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 The lung bases are otherwise clear. No pleural effusion. The heart is normal in size. No pericardial effusion. Hepatobiliary: The liver is normal in size. Non-cirrhotic configuration. These is diffuse hepatic steatosis. No suspicious mass. Note is made of at least 2, subcentimeter, ill-defined hypoattenuating foci in the right hepatic lobe, which are too small to adequately characterize but appears grossly unchanged since the prior study. No intrahepatic or extrahepatic bile duct dilation. No calcified gallstones. Normal gallbladder wall thickness. No pericholecystic inflammatory changes. Pancreas: Since the prior study, there is new heterogeneous and bulky appearance of pancreatic head/uncinate process. There is significant surrounding fat stranding. There are areas of the pancreatic head/uncinate process, which are hypoattenuating in comparison to the rest of the  pancreas. There is also an ill-defined approximately 1.2 x 1.2 cm hypoattenuating area in the uncinate process region without discrete wall. In appropriate clinical settings findings are concerning for necrotizing pancreatitis. The above-mentioned 1.2 x 1.2 cm hypoattenuating area may represent acute necrotic collection. No other peripancreatic collection noted. Correlate clinically. Rest of the pancreas is unremarkable. No focal mass. Main pancreatic duct is not dilated. Spleen: Within normal limits. No focal lesion. Adrenals/Urinary Tract: Adrenal glands are unremarkable. No suspicious renal mass. No hydronephrosis. No renal or ureteric calculi. Unremarkable urinary bladder. Stomach/Bowel: There is tiny sliding hiatal hernia. There is mild asymmetric irregular thickening of the 2/3 part of duodenum, likely secondary to pancreatitis. No disproportionate dilation of the small or large  bowel loops. No evidence of abnormal bowel wall thickening or inflammatory changes. The appendix is unremarkable. Vascular/Lymphatic: No ascites or pneumoperitoneum. No abdominal or pelvic lymphadenopathy, by size criteria. No aneurysmal dilation of the major abdominal arteries. PICC 1 Reproductive: Normal size prostate. There is a subtle ill-defined hypoattenuating area in the peripheral portion of the right prostatic mid gland/apex region, incompletely characterized on the current exam. Correlate with serum PSA level to determine the need for additional imaging with MRI exam. Symmetric seminal vesicles. Other: There is a tiny fat containing umbilical hernia. The soft tissues and abdominal wall are otherwise unremarkable. Musculoskeletal: No suspicious osseous lesions. There are mild multilevel degenerative changes in the visualized spine. IMPRESSION: 1. 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 There is an approximately 1.2 x 1.2 cm hypoattenuating area in the uncinate  process region, which may represent acute necrotic collection. No other peripancreatic collection. Correlate clinically. Findings may represent late acute versus subacute pancreatitis. 2. There is a subtle ill-defined hypoattenuating area in the peripheral portion of the right prostatic mid gland/apex region, incompletely characterized on the current exam. Correlate with serum PSA level to determine the need for additional imaging with MRI exam. 3. Multiple other nonacute observations, as described above. 4. 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 Electronically Signed   By: Beula Brunswick M.D.   On: 10/12/2023 12:41   DG Chest 1 View Result Date: 10/12/2023 CLINICAL DATA:  Rule out pneumonia. EXAM: CHEST  1 VIEW COMPARISON:  Chest radiograph dated 10/09/2009. FINDINGS: No focal consolidation, pleural effusion, or pneumothorax. The cardiac silhouette is within normal limits. No acute osseous pathology. IMPRESSION: No active disease. Electronically Signed   By: Angus Bark M.D.   On: 10/12/2023 10:48    IMPRESSION:  48 year old male presenting with complaints of abdominal pain and nausea/vomiting that began 3 weeks ago, progressively worsening. CT scan findings of necrotizing pancreatitis with an area of acute necrotic collection.  Lipase is normal.  LFTs are normal except for total bili 1.6.  CT scan specifically notes no gallstones or biliary ductal dilatation.  White blood cell count slightly elevated at 13.3 K.  Suspect this is due to alcohol as he drinks beer daily for 30 years.  Vitals ok.  Tmax 99.  Rectal bleeding:  Reports intermittent dark red rectal bleeding in the toilet bowl, sometimes independent of stool.  Reports that he knows he has a hemorrhoid.  Hgb normal.  PLAN: - Will check triglyceride level. - Supportive care with  IV fluids.  He received a liter bolus, but likely should have fluids, possibly 200 cc an hour.  Pain control and antiemetics. -? Need for abx in the form of Zosyn vs observation for now? -Will need eventual outpatient colonoscopy once acute issue resolves.   Martina Sledge. Mireille Lacombe  10/12/2023, 2:46 PM

## 2023-10-12 NOTE — ED Provider Notes (Signed)
 [facility] AT [facility] Provider Note   CSN: [id number] Arrival date & time: 10/12/23  3474     History  Chief Complaint  Patient presents with   Abdominal Pain    [name] is a 48 y.o. male.  HPI Patient presents with pain in the epigastrium.  Onset was within the past month, symptoms have worsened over the past days.  Patient drinks 2 or 3 40 ounce beers daily, smokes, but states that he is otherwise well, takes no medication, has no chronic medical problems. Over the past 3 to 4 days pain has become severe in the upper abdomen, midline, nonradiating.  No relief with anything.    Home Medications Prior to Admission medications   Medication Sig Start Date End Date Taking? Authorizing Provider  acetaminophen  (TYLENOL ) 325 MG tablet Take 2 tablets (650 mg total) by mouth every 4 (four) hours as needed for mild pain. 06/15/22   Kinsinger, Alphonso Aschoff, MD  amoxicillin -clavulanate (AUGMENTIN ) 875-125 MG per tablet Take 1 tablet by mouth every 12 (twelve) hours. Patient not taking: Reported on 07/15/2014 06/29/12   Szekalski, Kaitlyn, PA-C  docusate sodium  (COLACE) 100 MG capsule Take 1 capsule (100 mg total) by mouth 2 (two) times daily. 06/15/22   Kinsinger, Alphonso Aschoff, MD  doxycycline  (VIBRAMYCIN ) 100 MG capsule Take 1 capsule (100 mg total) by mouth 2 (two) times daily. 07/15/14   Carleton Cheek, PA-C  levofloxacin  (LEVAQUIN ) 500 MG tablet Take 1 tablet (500 mg total) by mouth daily. 07/18/14   Eino Gravel, MD  meclizine  (ANTIVERT ) 25 MG tablet Take 1 tablet (25 mg total) by mouth 3 (three) times daily as needed for dizziness. 09/17/21   Ballard Bongo, MD  oxyCODONE  (OXY IR/ROXICODONE ) 5 MG immediate release tablet Take 1 tablet (5 mg total) by mouth every 4 (four) hours as needed for moderate pain. 06/15/22   Kinsinger, Alphonso Aschoff, MD  oxyCODONE -acetaminophen  (PERCOCET/ROXICET) 5-325 MG per tablet Take 2 tablets by mouth every 4 (four) hours  as needed. 07/18/14   Eino Gravel, MD  pantoprazole  (PROTONIX ) 40 MG tablet Take 1 tablet (40 mg total) by mouth daily. 08/19/22   Prosperi, Christian H, PA-C  traMADol  (ULTRAM ) 50 MG tablet Take 1 tablet (50 mg total) by mouth every 6 (six) hours as needed for pain. 06/29/12   Szekalski, Kaitlyn, PA-C      Allergies    Patient has no known allergies.    Review of Systems   Review of Systems  Physical Exam Updated Vital Signs BP 123/77   Pulse 66   Temp 99 F (37.2 C) (Oral)   Resp 14   Ht 5\' 7"  (1.702 m)   Wt 88.5 kg   SpO2 100%   BMI 30.54 kg/m  Physical Exam Vitals and nursing note reviewed.  Constitutional:      General: He is not in acute distress.    Appearance: He is well-developed.  HENT:     Head: Normocephalic and atraumatic.  Eyes:     Conjunctiva/sclera: Conjunctivae normal.  Cardiovascular:     Rate and Rhythm: Normal rate and regular rhythm.  Pulmonary:     Effort: Pulmonary effort is normal. No respiratory distress.     Breath sounds: No stridor.  Abdominal:     General: There is no distension.     Tenderness: There is abdominal tenderness in the epigastric area.  Skin:    General: Skin is warm and dry.  Neurological:  Mental Status: He is alert and oriented to person, place, and time.     ED Results / Procedures / Treatments   Labs (all labs ordered are listed, but only abnormal results are displayed) Labs Reviewed  COMPREHENSIVE METABOLIC PANEL WITH GFR - Abnormal; Notable for the following components:      Result Value   Glucose, Bld 105 (*)    Albumin 3.2 (*)    Total Bilirubin 1.6 (*)    All other components within normal limits  CBC WITH DIFFERENTIAL/PLATELET - Abnormal; Notable for the following components:   WBC 13.3 (*)    Neutro Abs 8.5 (*)    Monocytes Absolute 1.3 (*)    All other components within normal limits  LIPASE, BLOOD  ETHANOL  TRIGLYCERIDES    EKG EKG Interpretation Date/Time:  Tuesday October 12 2023 09:02:53  EDT Ventricular Rate:  100 PR Interval:  133 QRS Duration:  87 QT Interval:  331 QTC Calculation: 427 R Axis:   90  Text Interpretation: Sinus tachycardia Borderline right axis deviation Confirmed by Dorenda Gandy [phone]) on 10/12/2023 9:12:09 AM  Radiology CT ABDOMEN PELVIS W CONTRAST Result Date: 10/12/2023 CLINICAL DATA:  Abdominal pain, acute, nonlocalized upper abd pain, worse w any motion. EXAM: CT ABDOMEN AND PELVIS WITH CONTRAST TECHNIQUE: Multidetector CT imaging of the abdomen and pelvis was performed using the standard protocol following bolus administration of intravenous contrast. RADIATION DOSE REDUCTION: This exam was performed according to the departmental dose-optimization program which includes automated exposure control, adjustment of the mA and/or kV according to patient size and/or use of iterative reconstruction technique. CONTRAST:  75mL OMNIPAQUE  IOHEXOL  350 MG/ML SOLN COMPARISON:  CT scan abdomen and pelvis from 08/19/2022. 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 The lung bases are otherwise clear. No pleural effusion. The heart is normal in size. No pericardial effusion. Hepatobiliary: The liver is normal in size. Non-cirrhotic configuration. These is diffuse hepatic steatosis. No suspicious mass. Note is made of at least 2, subcentimeter, ill-defined hypoattenuating foci in the right hepatic lobe, which are too small to adequately characterize but appears grossly unchanged since the prior study. No intrahepatic or extrahepatic bile duct dilation. No calcified gallstones. Normal gallbladder wall thickness. No pericholecystic inflammatory changes. Pancreas: Since the prior study, there is new heterogeneous and bulky appearance of pancreatic head/uncinate process. There is significant  surrounding fat stranding. There are areas of the pancreatic head/uncinate process, which are hypoattenuating in comparison to the rest of the pancreas. There is also an ill-defined approximately 1.2 x 1.2 cm hypoattenuating area in the uncinate process region without discrete wall. In appropriate clinical settings findings are concerning for necrotizing pancreatitis. The above-mentioned 1.2 x 1.2 cm hypoattenuating area may represent acute necrotic collection. No other peripancreatic collection noted. Correlate clinically. Rest of the pancreas is unremarkable. No focal mass. Main pancreatic duct is not dilated. Spleen: Within normal limits. No focal lesion. Adrenals/Urinary Tract: Adrenal glands are unremarkable. No suspicious renal mass. No hydronephrosis. No renal or ureteric calculi. Unremarkable urinary bladder. Stomach/Bowel: There is tiny sliding hiatal hernia. There is mild asymmetric irregular thickening of the 2/3 part of duodenum, likely secondary to pancreatitis. No disproportionate dilation of the small or large bowel loops. No evidence of abnormal bowel wall thickening or inflammatory changes. The appendix is unremarkable. Vascular/Lymphatic: No ascites or pneumoperitoneum. No abdominal or pelvic lymphadenopathy, by size criteria. No aneurysmal dilation  of the major abdominal arteries. PICC 1 Reproductive: Normal size prostate. There is a subtle ill-defined hypoattenuating area in the peripheral portion of the right prostatic mid gland/apex region, incompletely characterized on the current exam. Correlate with serum PSA level to determine the need for additional imaging with MRI exam. Symmetric seminal vesicles. Other: There is a tiny fat containing umbilical hernia. The soft tissues and abdominal wall are otherwise unremarkable. Musculoskeletal: No suspicious osseous lesions. There are mild multilevel degenerative changes in the visualized spine. IMPRESSION: 1. Findings favor probable necrotizing  pancreatitis involving the pancreatic head/uncinate process region. There is an approximately 1.2 x 1.2 cm hypoattenuating area in the uncinate process region, which may represent acute necrotic collection. No other peripancreatic collection. Correlate clinically. Findings may represent late acute versus subacute pancreatitis. 2. There is a subtle ill-defined hypoattenuating area in the peripheral portion of the right prostatic mid gland/apex region, incompletely characterized on the current exam. Correlate with serum PSA level to determine the need for additional imaging with MRI exam. 3. Multiple other nonacute observations, as described above. 4. 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 Electronically Signed   By: Beula Brunswick M.D.   On: 10/12/2023 12:41   DG Chest 1 View Result Date: 10/12/2023 CLINICAL DATA:  Rule out pneumonia. EXAM: CHEST  1 VIEW COMPARISON:  Chest radiograph dated 10/09/2009. FINDINGS: No focal consolidation, pleural effusion, or pneumothorax. The cardiac silhouette is within normal limits. No acute osseous pathology. IMPRESSION: No active disease. Electronically Signed   By: Angus Bark M.D.   On: 10/12/2023 10:48    Procedures Procedures    Medications Ordered in ED Medications  morphine  (PF) 4 MG/ML injection 4 mg (has no administration in time range)  lactated ringers  infusion (has no administration in time range)  piperacillin-tazobactam (ZOSYN) IVPB 3.375 g (has no administration in time range)  morphine  (PF) 4 MG/ML injection 4 mg (has no administration in time range)  fentaNYL  (SUBLIMAZE ) injection 50 mcg (50 mcg Intravenous Given 10/12/23 1040)  ondansetron  (ZOFRAN ) injection 4 mg (4 mg Intravenous Given 10/12/23 1040)  sucralfate (CARAFATE) 1 GM/10ML suspension 1 g (1 g Oral Given 10/12/23 1037)   famotidine (PEPCID) IVPB 20 mg premix (0 mg Intravenous Stopped 10/12/23 1213)  lactated ringers  bolus 1,000 mL (0 mLs Intravenous Stopped 10/12/23 1254)  iohexol  (OMNIPAQUE ) 350 MG/ML injection 75 mL (75 mLs Intravenous Contrast Given 10/12/23 1221)    ED Course/ Medical Decision Making/ A&P                                 Medical Decision Making Adult male with daily alcohol use presents with epigastric pain.  Patient also smokes, concern for pneumonia versus gastric etiology, less likely pneumoperitoneum with perforated ulcer, ACS. Labs monitoring IV fentanyl  Zofran  Pepcid Carafate started. Cardiac 95 sinus normal Pulse ox 100% room air normal   Amount and/or Complexity of Data Reviewed Labs: ordered. Decision-making details documented in ED Course. Radiology: ordered and independent interpretation performed. Decision-making details documented in ED Course. ECG/medicine tests: independent interpretation performed. Decision-making details documented in ED Course.  Risk Prescription drug management.  Patient remains in pain on repeat exam, initial labs notable for mild hyperbilirubinemia, lipase essentially normal.  ECG nonischemic, low suspicion for ACS. 3:34 PM Patient continued to complain of pain, requiring morphine  after initial fentanyl .  CT reviewed,  notable for evidence for necrotizing pancreatitis.  Patient's lipase normal, this may be secondary to persistency of pancreatitis given his description of symptoms that been present for possibly as long as 1 month, worse over the past few days.  GI has been consulted, initial recs for fluids, Zosyn, analgesia.         Final Clinical Impression(s) / ED Diagnoses Final diagnoses:  Acute necrotizing pancreatitis     Dorenda Gandy, MD 10/12/23 1535

## 2023-10-12 NOTE — H&P (Signed)
 History and Physical    Patient: Evan West VHQ:[id number] DOB: [birth year] DOA: 10/12/2023 DOS: the patient was seen and examined on 10/12/2023 PCP: System, Provider Not In  Patient coming from: Home  Chief Complaint:  Chief Complaint  Patient presents with   Abdominal Pain   HPI: Elmond Ouk is a 48 y.o. male with medical history significant of alcohol use disorder presenting to the ED with abdominal pain.  Patient reports that he has been having intermittent abdominal pain for over a month.  States that his stomach pain comes and goes and is not associated with meals.  He did not have any nausea and vomiting in the past associated with this.  States that over the last 3 days, his abdominal pain has significantly worsened.  His abdominal pain is located in the epigastric region and has remained constant over the past 3 days.  He did began having nausea and vomiting over this time as well and it has been associated with large/heavy meals.  States that he was able to eat small meals without any difficulties and has been able to tolerate fluids.  However, when he eats heavier oily meals, it comes back up in the next 30 minutes to an hour.  He denies any fevers, chills, chest pain, palpitations, shortness of breath, cough, urinary changes.  Does note that he has not had a bowel movement in the last couple of days.  Patient is not on any medications at home.  He denies any previous medical history.  He does report daily alcohol use, ranging from about one drink to about five 40 ounce drinks depending on his mood.  He denies any previous history of alcohol withdrawals.  Last drink yesterday.  ED course: Vital signs stable.  CBC with leukocytosis to 13.3, otherwise unremarkable.  CMP with albumin 3.2, total bilirubin 1.6, kidney function normal.  Lipase 51.  Ethanol level negative.  Triglycerides 77.  Chest x-ray unremarkable.  CT abdomen pelvis with contrast showing likely  necrotizing pancreatitis involving the pancreatic head/uncinate process region with a 1.2 x 1.2 cm hypoattenuating area in the uncinate process region favoring acute necrotic collection.  CT also noted subtle ill-defined hypoattenuating area in the peripheral portion of the right prostatic mid gland/apex region, incompletely characterized on exam.  GI was consulted by ED provider.  Patient started on IV fluids and Zosyn.  [facility] hospitalist asked to evaluate patient for admission.  Review of Systems: As mentioned in the history of present illness. All other systems reviewed and are negative.  History reviewed. No pertinent past medical history. History reviewed. No pertinent surgical history. Social History:  reports that he has been smoking cigarettes. He has a 3.6 pack-year smoking history. He does not have any smokeless tobacco history on file. He reports current alcohol use. He reports that he does not use drugs.  No Known Allergies  Family History  Problem Relation Age of Onset   Diabetes Other     Prior to Admission medications   Medication Sig Start Date End Date Taking? Authorizing Provider  acetaminophen  (TYLENOL ) 325 MG tablet Take 2 tablets (650 mg total) by mouth every 4 (four) hours as needed for mild pain. 06/15/22   Kinsinger, Alphonso Aschoff, MD  amoxicillin -clavulanate (AUGMENTIN ) 875-125 MG per tablet Take 1 tablet by mouth every 12 (twelve) hours. Patient not taking: Reported on 07/15/2014 06/29/12   Szekalski, Kaitlyn, PA-C  docusate sodium  (COLACE) 100 MG capsule Take 1 capsule (100 mg total) by mouth 2 (two)  times daily. 06/15/22   Kinsinger, Alphonso Aschoff, MD  doxycycline  (VIBRAMYCIN ) 100 MG capsule Take 1 capsule (100 mg total) by mouth 2 (two) times daily. 07/15/14   Carleton Cheek, PA-C  levofloxacin  (LEVAQUIN ) 500 MG tablet Take 1 tablet (500 mg total) by mouth daily. 07/18/14   Eino Gravel, MD  meclizine  (ANTIVERT ) 25 MG tablet Take 1 tablet (25 mg total) by mouth 3 (three) times daily  as needed for dizziness. 09/17/21   Pollina, Marine Sia, MD  oxyCODONE  (OXY IR/ROXICODONE ) 5 MG immediate release tablet Take 1 tablet (5 mg total) by mouth every 4 (four) hours as needed for moderate pain. 06/15/22   Kinsinger, Alphonso Aschoff, MD  oxyCODONE -acetaminophen  (PERCOCET/ROXICET) 5-325 MG per tablet Take 2 tablets by mouth every 4 (four) hours as needed. 07/18/14   Eino Gravel, MD  pantoprazole  (PROTONIX ) 40 MG tablet Take 1 tablet (40 mg total) by mouth daily. 08/19/22   Prosperi, Christian H, PA-C  traMADol  (ULTRAM ) 50 MG tablet Take 1 tablet (50 mg total) by mouth every 6 (six) hours as needed for pain. 06/29/12   Szekalski, Kaitlyn, PA-C    Physical Exam: Vitals:   10/12/23 1315 10/12/23 1330 10/12/23 1345 10/12/23 1400  BP: 134/80 105/62 107/80 123/77  Pulse: 76 68 68 66  Resp: (!) 22 12 14 14   Temp:      TempSrc:      SpO2: 100% 100% 100% 100%  Weight:      Height:       Physical Exam Constitutional:      Appearance: He is well-developed. He is obese. He is not ill-appearing.  HENT:     Head: Normocephalic and atraumatic.     Mouth/Throat:     Mouth: Mucous membranes are dry.     Pharynx: Oropharynx is clear. No oropharyngeal exudate.  Eyes:     General: No scleral icterus.    Extraocular Movements: Extraocular movements intact.     Conjunctiva/sclera: Conjunctivae normal.     Pupils: Pupils are equal, round, and reactive to light.  Cardiovascular:     Rate and Rhythm: Normal rate and regular rhythm.     Heart sounds: Normal heart sounds. No murmur heard.    No friction rub. No gallop.  Pulmonary:     Effort: Pulmonary effort is normal. No respiratory distress.     Breath sounds: Normal breath sounds. No wheezing, rhonchi or rales.  Abdominal:     General: There is no distension.     Palpations: Abdomen is soft.     Comments: Epigastric and periumbilical tenderness to palpation. No guarding or rebound tenderness noted. Decreased bowel sounds.  Musculoskeletal:         General: No swelling. Normal range of motion.     Cervical back: Normal range of motion.  Skin:    General: Skin is warm and dry.  Neurological:     General: No focal deficit present.     Mental Status: He is alert and oriented to person, place, and time.  Psychiatric:        Mood and Affect: Mood normal.        Behavior: Behavior normal.     Data Reviewed:  There are no new results to review at this time.    Latest Ref Rng & Units 10/12/2023   10:35 AM 08/19/2022    2:44 PM 06/15/2022    3:15 AM  CBC  WBC 4.0 - 10.5 K/uL 13.3  9.8  13.8   Hemoglobin 13.0 -  17.0 g/dL 95.6  21.3  8.0   Hematocrit 39.0 - 52.0 % 42.8  47.7  23.5   Platelets 150 - 400 K/uL 174  238  208       Latest Ref Rng & Units 10/12/2023   10:35 AM 08/19/2022    2:44 PM 06/13/2022    3:29 AM  CMP  Glucose 70 - 99 mg/dL 086  578  469   BUN 6 - 20 mg/dL 7  8  <5   Creatinine 6.29 - 1.24 mg/dL 5.28  4.13  2.44   Sodium 135 - 145 mmol/L 135  134  137   Potassium 3.5 - 5.1 mmol/L 4.1  3.7  3.6   Chloride 98 - 111 mmol/L 100  97  104   CO2 22 - 32 mmol/L 24  21  25    Calcium 8.9 - 10.3 mg/dL 8.9  9.0  8.0   Total Protein 6.5 - 8.1 g/dL 7.3  8.6    Total Bilirubin 0.0 - 1.2 mg/dL 1.6  0.8    Alkaline Phos 38 - 126 U/L 66  104    AST 15 - 41 U/L 20  28    ALT 0 - 44 U/L 26  30     Lipase     Component Value Date/Time   LIPASE 51 10/12/2023 1035   CT ABDOMEN PELVIS W CONTRAST Result Date: 10/12/2023 CLINICAL DATA:  Abdominal pain, acute, nonlocalized upper abd pain, worse w any motion. EXAM: CT ABDOMEN AND PELVIS WITH CONTRAST TECHNIQUE: Multidetector CT imaging of the abdomen and pelvis was performed using the standard protocol following bolus administration of intravenous contrast. RADIATION DOSE REDUCTION: This exam was performed according to the departmental dose-optimization program which includes automated exposure control, adjustment of the mA and/or kV according to patient size and/or use of iterative  reconstruction technique. CONTRAST:  75mL OMNIPAQUE  IOHEXOL  350 MG/ML SOLN COMPARISON:  CT scan abdomen and pelvis from 08/19/2022. 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 The lung bases are otherwise clear. No pleural effusion. The heart is normal in size. No pericardial effusion. Hepatobiliary: The liver is normal in size. Non-cirrhotic configuration. These is diffuse hepatic steatosis. No suspicious mass. Note is made of at least 2, subcentimeter, ill-defined hypoattenuating foci in the right hepatic lobe, which are too small to adequately characterize but appears grossly unchanged since the prior study. No intrahepatic or extrahepatic bile duct dilation. No calcified gallstones. Normal gallbladder wall thickness. No pericholecystic inflammatory changes. Pancreas: Since the prior study, there is new heterogeneous and bulky appearance of pancreatic head/uncinate process. There is significant surrounding fat stranding. There are areas of the pancreatic head/uncinate process, which are hypoattenuating in comparison to the rest of the pancreas. There is also an ill-defined approximately 1.2 x 1.2 cm hypoattenuating area in the uncinate process region without discrete wall. In appropriate clinical settings findings are concerning for necrotizing pancreatitis. The above-mentioned 1.2 x 1.2 cm hypoattenuating area may represent acute necrotic collection. No other peripancreatic collection noted. Correlate clinically. Rest of the pancreas is unremarkable. No focal mass. Main pancreatic duct is not dilated. Spleen: Within normal limits. No focal lesion. Adrenals/Urinary Tract: Adrenal glands are unremarkable. No suspicious renal mass. No hydronephrosis. No renal or ureteric calculi. Unremarkable urinary bladder. Stomach/Bowel: There is  tiny sliding hiatal hernia. There is mild asymmetric irregular thickening of the 2/3 part of duodenum, likely  secondary to pancreatitis. No disproportionate dilation of the small or large bowel loops. No evidence of abnormal bowel wall thickening or inflammatory changes. The appendix is unremarkable. Vascular/Lymphatic: No ascites or pneumoperitoneum. No abdominal or pelvic lymphadenopathy, by size criteria. No aneurysmal dilation of the major abdominal arteries. PICC 1 Reproductive: Normal size prostate. There is a subtle ill-defined hypoattenuating area in the peripheral portion of the right prostatic mid gland/apex region, incompletely characterized on the current exam. Correlate with serum PSA level to determine the need for additional imaging with MRI exam. Symmetric seminal vesicles. Other: There is a tiny fat containing umbilical hernia. The soft tissues and abdominal wall are otherwise unremarkable. Musculoskeletal: No suspicious osseous lesions. There are mild multilevel degenerative changes in the visualized spine. IMPRESSION: 1. 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 There is an approximately 1.2 x 1.2 cm hypoattenuating area in the uncinate process region, which may represent acute necrotic collection. No other peripancreatic collection. Correlate clinically. Findings may represent late acute versus subacute pancreatitis. 2. There is a subtle ill-defined hypoattenuating area in the peripheral portion of the right prostatic mid gland/apex region, incompletely characterized on the current exam. Correlate with serum PSA level to determine the need for additional imaging with MRI exam. 3. Multiple other nonacute observations, as described above. 4. There are faint diffuse poorly defined centrilobular ground-glass nodules throughout visualized lungs, nonspecific but most commonly seen with respiratory bronchiolitis interstitial lung disease  (RB-ILD). Electronically Signed   By: Beula Brunswick M.D.   On: 10/12/2023 12:41   DG Chest 1 View Result Date: 10/12/2023 CLINICAL DATA:  Rule out pneumonia. EXAM: CHEST  1 VIEW COMPARISON:  Chest radiograph dated 10/09/2009. FINDINGS: No focal consolidation, pleural effusion, or pneumothorax. The cardiac silhouette is within normal limits. No acute osseous pathology. IMPRESSION: No active disease. Electronically Signed   By: Angus Bark M.D.   On: 10/12/2023 10:48    Assessment and Plan: No notes have been filed under this hospital service. Service: Hospitalist  Necrotizing pancreatitis Patient presenting with abdominal pain that began over a month ago and has worsened over the past few days along with nausea and nonbilious nonbloody emesis over the past 3 days.  CT imaging showing necrotizing pancreatitis with an area of acute necrotic collection.  His lipase is normal and liver enzymes are normal except for mildly elevated total bilirubin.  No gallstones or biliary ductal dilation noted on CT imaging.  Triglyceride level normal.  He does have a mild leukocytosis of 13.3.  He has remained afebrile since arrival.  He is currently hemodynamically stable.  Patient does report daily alcohol use for about 30 years and thus suspect this is the likely etiology.  GI has been consulted by ED provider, recommended supportive care with IV fluids and initiation of Zosyn.  GI to likely perform outpatient colonoscopy once acute illness is resolved. - GI following, appreciate assistance - Continue empiric Zosyn per pharmacy - Status post 1 L IV fluid bolus, currently on maintenance IV fluids at 200 cc/hour for 12 hours per GI recs - Zofran  every 6 hours as needed for nausea - Pain control with Tylenol  every 6 hours as needed, oxycodone  4 mg every 6 hours as needed, Dilaudid 1 mg every 2 hours as needed - N.p.o. -Lovenox  for DVT prophylaxis - Place in observation/progressive  Subtle ill-defined  hypoattenuating area in the peripheral portion of the right prostatic mid gland/apex region - Noted on CT imaging - Follow-up PSA level to determine  need for additional imaging with MRI  Hemorrhoids Patient with known hemorrhoids. Does report intermittent BRBPR mainly noted on toilet paper with wiping, chronic. Hgb normal and stable. -GI following -trend hgb curve  Alcohol use disorder Alcoholic fatty liver disease -notes daily alcohol use ranging from one drink to about five 40oz drinks per day -no history of alcohol withdrawal per patient -placed on CIWA without ativan  for now - Alcoholic fatty liver disease noted on CT imaging - TOC consult placed for substance use resources   Advance Care Planning:   Code Status: Full Code   Consults: GI  Family Communication: no family at bedside  Severity of Illness: The appropriate patient status for this patient is OBSERVATION. Observation status is judged to be reasonable and necessary in order to provide the required intensity of service to ensure the patient's safety. The patient's presenting symptoms, physical exam findings, and initial radiographic and laboratory data in the context of their medical condition is felt to place them at decreased risk for further clinical deterioration. Furthermore, it is anticipated that the patient will be medically stable for discharge from the hospital within 2 midnights of admission.   Portions of this note were generated with Scientist, clinical (histocompatibility and immunogenetics). Dictation errors may occur despite best attempts at proofreading.   Author: Meagon Duskin H Arth Nicastro, MD 10/12/2023 4:34 PM  For on call review [email or web address].

## 2023-10-12 NOTE — TOC CM/SW Note (Signed)
 SW added substance abuse resources to patients AVS.  .Winfield Hau, MSW, LCSWA Transition of Care  Clinical Social Worker (ED 3-11 Mon-Fri)  [phone]

## 2023-10-12 NOTE — ED Triage Notes (Signed)
 Reports mid abd pain x 3 days worse since yesterday. Reports vomiting after eating. Admits to drinking beer everyday.  Reports sometimes there is blood in his stool.  Tender to touch.

## 2023-10-13 DIAGNOSIS — K700 Alcoholic fatty liver: Secondary | ICD-10-CM | POA: Diagnosis present

## 2023-10-13 DIAGNOSIS — R9349 Abnormal radiologic findings on diagnostic imaging of other urinary organs: Secondary | ICD-10-CM | POA: Diagnosis present

## 2023-10-13 DIAGNOSIS — Z79899 Other long term (current) drug therapy: Secondary | ICD-10-CM | POA: Diagnosis not present

## 2023-10-13 DIAGNOSIS — K649 Unspecified hemorrhoids: Secondary | ICD-10-CM | POA: Diagnosis present

## 2023-10-13 DIAGNOSIS — R000 Tachycardia, unspecified: Secondary | ICD-10-CM | POA: Diagnosis present

## 2023-10-13 DIAGNOSIS — J849 Interstitial pulmonary disease, unspecified: Secondary | ICD-10-CM | POA: Diagnosis present

## 2023-10-13 DIAGNOSIS — K8591 Acute pancreatitis with uninfected necrosis, unspecified: Secondary | ICD-10-CM | POA: Diagnosis not present

## 2023-10-13 DIAGNOSIS — K625 Hemorrhage of anus and rectum: Secondary | ICD-10-CM | POA: Diagnosis present

## 2023-10-13 DIAGNOSIS — R109 Unspecified abdominal pain: Secondary | ICD-10-CM | POA: Diagnosis present

## 2023-10-13 DIAGNOSIS — F17210 Nicotine dependence, cigarettes, uncomplicated: Secondary | ICD-10-CM | POA: Diagnosis present

## 2023-10-13 DIAGNOSIS — F1010 Alcohol abuse, uncomplicated: Secondary | ICD-10-CM | POA: Diagnosis present

## 2023-10-13 DIAGNOSIS — K8521 Alcohol induced acute pancreatitis with uninfected necrosis: Secondary | ICD-10-CM | POA: Diagnosis present

## 2023-10-13 DIAGNOSIS — Z833 Family history of diabetes mellitus: Secondary | ICD-10-CM | POA: Diagnosis not present

## 2023-10-13 LAB — CBC
HCT: 39.3 % (ref 39.0–52.0)
Hemoglobin: 12.9 g/dL — ABNORMAL LOW (ref 13.0–17.0)
MCH: 32.5 pg (ref 26.0–34.0)
MCHC: 32.8 g/dL (ref 30.0–36.0)
MCV: 99 fL (ref 80.0–100.0)
Platelets: 165 10*3/uL (ref 150–400)
RBC: 3.97 MIL/uL — ABNORMAL LOW (ref 4.22–5.81)
RDW: 13.9 % (ref 11.5–15.5)
WBC: 10.7 10*3/uL — ABNORMAL HIGH (ref 4.0–10.5)
nRBC: 0 % (ref 0.0–0.2)

## 2023-10-13 LAB — COMPREHENSIVE METABOLIC PANEL WITH GFR
ALT: 23 U/L (ref 0–44)
AST: 16 U/L (ref 15–41)
Albumin: 2.9 g/dL — ABNORMAL LOW (ref 3.5–5.0)
Alkaline Phosphatase: 58 U/L (ref 38–126)
Anion gap: 8 (ref 5–15)
BUN: 7 mg/dL (ref 6–20)
CO2: 26 mmol/L (ref 22–32)
Calcium: 8.6 mg/dL — ABNORMAL LOW (ref 8.9–10.3)
Chloride: 101 mmol/L (ref 98–111)
Creatinine, Ser: 0.93 mg/dL (ref 0.61–1.24)
GFR, Estimated: >60 mL/min (ref >60)
Glucose, Bld: 71 mg/dL (ref 70–99)
Potassium: 3.9 mmol/L (ref 3.5–5.1)
Sodium: 135 mmol/L (ref 135–145)
Total Bilirubin: 1.2 mg/dL (ref 0.0–1.2)
Total Protein: 6.7 g/dL (ref 6.5–8.1)

## 2023-10-13 LAB — LIPASE, BLOOD: Lipase: 85 U/L — ABNORMAL HIGH (ref 11–51)

## 2023-10-13 LAB — PSA: Prostatic Specific Antigen: 2.12 ng/mL (ref 0.00–4.00)

## 2023-10-13 MED ORDER — LACTATED RINGERS IV SOLN: INTRAVENOUS | Status: DC

## 2023-10-13 MED ORDER — PIPERACILLIN-TAZOBACTAM 3.375 G IVPB
3.3750 g | Freq: Three times a day (TID) | INTRAVENOUS | Status: DC
  Administered 2023-10-13 – 2023-10-14 (×3): 3.375 g via INTRAVENOUS
  Filled 2023-10-13 (×3): qty 50

## 2023-10-13 MED ORDER — CHLORDIAZEPOXIDE HCL 5 MG PO CAPS
10.0000 mg | ORAL_CAPSULE | Freq: Four times a day (QID) | ORAL | Status: DC
  Administered 2023-10-13: 10 mg via ORAL
  Filled 2023-10-13: qty 2

## 2023-10-13 NOTE — Progress Notes (Signed)
 PROGRESS NOTE    Evan West  WUJ:[id number] DOB: 12/30/1975 DOA: 10/12/2023 PCP: System, Provider Not In    Chief Complaint  Patient presents with   Abdominal Pain    Brief Narrative:     Evan West is a 48 y.o. male with medical history significant of alcohol use disorder presenting to the ED with abdominal pain.  Workup significant for necrotizing pancreatitis, admitted for further workup.    Assessment & Plan:   Principal Problem:   Necrotizing pancreatitis Active Problems:   Abnormal CT scan, pelvis   Rectal bleeding  Necrotizing pancreatitis -Due to alcohol abuse, no evidence of gallstones, and triglycerides within normal limits -DC IV Zosyn in 24 hours if remains afebrile and white blood cell count remain within normal limit. - Abdominal pain has improved, will start on clear liquid diet, but remains with poor oral intake, continue with IV fluids at 125 cc/h-GI input greatly appreciated. - Continue with aggressive IV fluids -Continue with as needed pain regimen   Subtle ill-defined hypoattenuating area in the peripheral portion of the right prostatic mid gland/apex region - Noted on CT imaging - PSA within normal limit -Will need follow-up with urology as an outpatient   Hemorrhoids Patient with known hemorrhoids. Does report intermittent BRBPR mainly noted on toilet paper with wiping, chronic. Hgb normal and stable.    Alcohol use disorder Alcoholic fatty liver disease -notes daily alcohol use ranging from one drink to about five 40oz drinks per day -no history of alcohol withdrawal per patient -placed on CIWA without ativan  for now, I have offered patient scheduled Librium to prevent withdrawals, but for now he is adamant about not receiving any anxiolytics, will monitor closely - Alcoholic fatty liver disease noted on CT imaging    DVT prophylaxis: Lovenox  Code Status: Full code Family Communication: None at  bedside Disposition:   Status is: Inpatient    Consultants:  GI   Subjective: Report epigastric pain is controlled on current regimen, asking to drink, so did start him on clear liquid  Objective: Vitals:   10/12/23 2340 10/13/23 0425 10/13/23 0803 10/13/23 1148  BP: 124/71 115/65 124/67 114/63  Pulse: (!) 55 (!) 51 68 60  Resp: 12 15 15 16   Temp: 98.7 F (37.1 C) 98.4 F (36.9 C) 98.6 F (37 C) 98.5 F (36.9 C)  TempSrc: Oral Oral Oral Oral  SpO2: 98% 96% 98% 98%  Weight:      Height:        Intake/Output Summary (Last 24 hours) at 10/13/2023 1322 Last data filed at 10/13/2023 0502 Gross per 24 hour  Intake 2343.13 ml  Output --  Net 2343.13 ml   Filed Weights   10/12/23 0904  Weight: 88.5 kg    Examination:  Awake Alert, Oriented X 3, No new F.N deficits, Normal affect Symmetrical Chest wall movement, Good air movement bilaterally, CTAB RRR,No Gallops,Rubs or new Murmurs, No Parasternal Heave +ve B.Sounds, Abd Soft, epigastric tenderness present No Cyanosis, Clubbing or edema, No new Rash or bruise       Data Reviewed: I have personally reviewed following labs and imaging studies  CBC: Recent Labs  Lab 10/12/23 1035 10/13/23 0412  WBC 13.3* 10.7*  NEUTROABS 8.5*  --   HGB 14.2 12.9*  HCT 42.8 39.3  MCV 98.8 99.0  PLT 174 165    Basic Metabolic Panel: Recent Labs  Lab 10/12/23 1035 10/13/23 0412  NA 135 135  K 4.1 3.9  CL 100 101  CO2 24 26  GLUCOSE 105* 71  BUN 7 7  CREATININE 0.81 0.93  CALCIUM 8.9 8.6*    GFR: Estimated Creatinine Clearance: 104.3 mL/min (by C-G formula based on SCr of 0.93 mg/dL).  Liver Function Tests: Recent Labs  Lab 10/12/23 1035 10/13/23 0412  AST 20 16  ALT 26 23  ALKPHOS 66 58  BILITOT 1.6* 1.2  PROT 7.3 6.7  ALBUMIN 3.2* 2.9*    CBG: No results for input(s): "GLUCAP" in the last 168 hours.   No results found for this or any previous visit (from the past 240 hours).       Radiology  Studies: CT ABDOMEN PELVIS W CONTRAST Result Date: 10/12/2023 CLINICAL DATA:  Abdominal pain, acute, nonlocalized upper abd pain, worse w any motion. EXAM: CT ABDOMEN AND PELVIS WITH CONTRAST TECHNIQUE: Multidetector CT imaging of the abdomen and pelvis was performed using the standard protocol following bolus administration of intravenous contrast. RADIATION DOSE REDUCTION: This exam was performed according to the departmental dose-optimization program which includes automated exposure control, adjustment of the mA and/or kV according to patient size and/or use of iterative reconstruction technique. CONTRAST:  75mL OMNIPAQUE  IOHEXOL  350 MG/ML SOLN COMPARISON:  CT scan abdomen and pelvis from 08/19/2022. 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 The lung bases are otherwise clear. No pleural effusion. The heart is normal in size. No pericardial effusion. Hepatobiliary: The liver is normal in size. Non-cirrhotic configuration. These is diffuse hepatic steatosis. No suspicious mass. Note is made of at least 2, subcentimeter, ill-defined hypoattenuating foci in the right hepatic lobe, which are too small to adequately characterize but appears grossly unchanged since the prior study. No intrahepatic or extrahepatic bile duct dilation. No calcified gallstones. Normal gallbladder wall thickness. No pericholecystic inflammatory changes. Pancreas: Since the prior study, there is new heterogeneous and bulky appearance of pancreatic head/uncinate process. There is significant surrounding fat stranding. There are areas of the pancreatic head/uncinate process, which are hypoattenuating in comparison to the rest of the pancreas. There is also an ill-defined approximately 1.2 x 1.2 cm hypoattenuating area in the uncinate process region without  discrete wall. In appropriate clinical settings findings are concerning for necrotizing pancreatitis. The above-mentioned 1.2 x 1.2 cm hypoattenuating area may represent acute necrotic collection. No other peripancreatic collection noted. Correlate clinically. Rest of the pancreas is unremarkable. No focal mass. Main pancreatic duct is not dilated. Spleen: Within normal limits. No focal lesion. Adrenals/Urinary Tract: Adrenal glands are unremarkable. No suspicious renal mass. No hydronephrosis. No renal or ureteric calculi. Unremarkable urinary bladder. Stomach/Bowel: There is tiny sliding hiatal hernia. There is mild asymmetric irregular thickening of the 2/3 part of duodenum, likely secondary to pancreatitis. No disproportionate dilation of the small or large bowel loops. No evidence of abnormal bowel wall thickening or inflammatory changes. The appendix is unremarkable. Vascular/Lymphatic: No ascites or pneumoperitoneum. No abdominal or pelvic lymphadenopathy, by size criteria. No aneurysmal dilation of the major abdominal arteries. PICC 1 Reproductive: Normal size prostate. There is a subtle ill-defined hypoattenuating area in the peripheral portion of the right prostatic mid gland/apex region, incompletely characterized on the current exam. Correlate with serum PSA level to determine the need for additional imaging with MRI exam. Symmetric seminal vesicles. Other: There is a tiny fat containing umbilical hernia. The soft tissues and abdominal wall are otherwise unremarkable. Musculoskeletal: No suspicious osseous lesions. There are mild  multilevel degenerative changes in the visualized spine. IMPRESSION: 1. 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 There is an approximately 1.2 x 1.2 cm hypoattenuating area in the uncinate process region, which may represent acute necrotic collection. No other peripancreatic collection. Correlate clinically. Findings  may represent late acute versus subacute pancreatitis. 2. There is a subtle ill-defined hypoattenuating area in the peripheral portion of the right prostatic mid gland/apex region, incompletely characterized on the current exam. Correlate with serum PSA level to determine the need for additional imaging with MRI exam. 3. Multiple other nonacute observations, as described above. 4. 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 Electronically Signed   By: Beula Brunswick M.D.   On: 10/12/2023 12:41   DG Chest 1 View Result Date: 10/12/2023 CLINICAL DATA:  Rule out pneumonia. EXAM: CHEST  1 VIEW COMPARISON:  Chest radiograph dated 10/09/2009. FINDINGS: No focal consolidation, pleural effusion, or pneumothorax. The cardiac silhouette is within normal limits. No acute osseous pathology. IMPRESSION: No active disease. Electronically Signed   By: Angus Bark M.D.   On: 10/12/2023 10:48        Scheduled Meds:  enoxaparin  (LOVENOX ) injection  40 mg Subcutaneous Q24H   Continuous Infusions:  lactated ringers  125 mL/hr at 10/13/23 0947   piperacillin-tazobactam 3.375 g (10/13/23 1225)     LOS: 0 days       Seena Dadds, MD [facility]   To contact the attending provider between 7A-7P or the covering provider during after hours 7P-7A, please log into the web site [email or web address] and access using universal [place] password for that web site. If you do not have the password, please call the hospital operator.  10/13/2023, 1:22 PM

## 2023-10-13 NOTE — Plan of Care (Signed)

## 2023-10-13 NOTE — TOC CM/SW Note (Signed)
 Transition of Care [facility]) - Inpatient Brief Assessment   Patient Details  Name: Evan West MRN: [id number] Date of Birth: 12-03-1975  [facility]) CM/SW Contact:    Jannice Mends, LCSW Phone Number: 10/13/2023, 3:56 PM   Clinical Narrative: CSW met with patient. He reported living alone and does not have a PCP but is interested in obtaining one. He declined substance use resources and stated he can stop drinking ETOH at any time with no issues but he likes drinking. Will request TOC follow up with PCP appointment.    Transition of Care Asessment: Insurance and Status: Insurance coverage has been reviewed Patient has primary care physician: No Home environment has been reviewed: From home Prior level of function:: Independent Prior/Current Home Services: No current home services Social Drivers of Health Review: SDOH reviewed no interventions necessary Readmission risk has been reviewed: Yes Transition of care needs: transition of care needs identified, TOC will continue to follow

## 2023-10-13 NOTE — Progress Notes (Signed)
 Daily Progress Note  DOA: 10/12/2023 Hospital Day: 2   Chief Complaint:  necrotizing pancreatitis  ASSESSMENT    48 y.o. year old male with a history of Etoh use disorder, liver laceration from stable wounds in 2023. Admitted yesterday with abdominal pain , N/V and necrotizing pancreatitis. See 4/29 consult note.   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 Positive tobacco use. Normal triglycerides.  Not on any home medications , not hypercalcemic.  Not suspected to be biliary given normal LFTs and absence of biliary duct dilation on imaging Today:  Pain is better than compared to what it was on admission but still required pain meds this a.m. . He has been fluid resuscitated but not taking much of the clear liquid diet. Hct 39.3, renal function normal. WBC has almost normalized. On clear liquids  Intermittent rectal bleeding Hemoglobin seems to be normal.  It was 14 on admission, now at 12.9 but that was after aggressive IV fluid resuscitation.   Principal Problem:   Necrotizing pancreatitis Active Problems:   Abnormal CT scan, pelvis   Rectal bleeding   PLAN   --Getting Zosyn but probably doesn't need it.  --Continue IV fluids at 125 ml for now since he is not taking much p.o. fluid.  --Will reassess tomorrow and he has been able to tolerate some of the clears then will possibly advance diet.  --Needs alcohol cessation which we discussed today --Outpatient colonoscopy for evaluation of intermittent rectal bleeding and age    Subjective   Had transient upper abdominal discomfort after trying broth this a.m.  No rectal bleeding today (has not had a bowel movement)   Objective   GI Studies:   Normal triglycerides  Recent Labs    10/12/23 1035 10/13/23 0412  WBC 13.3* 10.7*  HGB 14.2 12.9*  HCT 42.8 39.3  MCV 98.8 99.0  PLT 174 165   No results for input(s): "FOLATE", "VITAMINB12", "FERRITIN", "TIBC", "IRONPCTSAT" in the last 72  hours. Recent Labs    10/12/23 1035 10/13/23 0412  NA 135 135  K 4.1 3.9  CL 100 101  CO2 24 26  GLUCOSE 105* 71  BUN 7 7  CREATININE 0.81 0.93  CALCIUM 8.9 8.6*   Recent Labs    10/12/23 1035 10/13/23 0412  PROT 7.3 6.7  ALBUMIN 3.2* 2.9*  AST 20 16  ALT 26 23  ALKPHOS 66 58  BILITOT 1.6* 1.2   No results for input(s): "INR" in the last 72 hours. No results for input(s): "AFPTUMOR" in the last 72 hours.   No results for input(s): "ANA" in the last 72 hours.  Imaging:  CT ABDOMEN PELVIS W CONTRAST CLINICAL DATA:  Abdominal pain, acute, nonlocalized upper abd pain, worse w any motion.  EXAM: CT ABDOMEN AND PELVIS WITH CONTRAST  TECHNIQUE: Multidetector CT imaging of the abdomen and pelvis was performed using the standard protocol following bolus administration of intravenous contrast.  RADIATION DOSE REDUCTION: This exam was performed according to the departmental dose-optimization program which includes automated exposure control, adjustment of the mA and/or kV according to patient size and/or use of iterative reconstruction technique.  CONTRAST:  75mL OMNIPAQUE  IOHEXOL  350 MG/ML SOLN  COMPARISON:  CT scan abdomen and pelvis from 08/19/2022.  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 The lung bases are otherwise clear. No pleural effusion. The heart is normal  in size. No pericardial effusion.  Hepatobiliary: The liver is normal in size. Non-cirrhotic configuration. These is diffuse hepatic steatosis. No suspicious mass. Note is made of at least 2, subcentimeter, ill-defined hypoattenuating foci in the right hepatic lobe, which are too small to adequately characterize but appears grossly unchanged since the prior study. No intrahepatic or extrahepatic bile duct dilation.  No calcified gallstones. Normal gallbladder wall thickness. No pericholecystic inflammatory changes.  Pancreas: Since the prior study, there is new heterogeneous and bulky appearance of pancreatic head/uncinate process. There is significant surrounding fat stranding. There are areas of the pancreatic head/uncinate process, which are hypoattenuating in comparison to the rest of the pancreas. There is also an ill-defined approximately 1.2 x 1.2 cm hypoattenuating area in the uncinate process region without discrete wall. In appropriate clinical settings findings are concerning for necrotizing pancreatitis. The above-mentioned 1.2 x 1.2 cm hypoattenuating area may represent acute necrotic collection. No other peripancreatic collection noted. Correlate clinically.  Rest of the pancreas is unremarkable. No focal mass. Main pancreatic duct is not dilated.  Spleen: Within normal limits. No focal lesion.  Adrenals/Urinary Tract: Adrenal glands are unremarkable. No suspicious renal mass. No hydronephrosis. No renal or ureteric calculi. Unremarkable urinary bladder.  Stomach/Bowel: There is tiny sliding hiatal hernia. There is mild asymmetric irregular thickening of the 2/3 part of duodenum, likely secondary to pancreatitis. No disproportionate dilation of the small or large bowel loops. No evidence of abnormal bowel wall thickening or inflammatory changes. The appendix is unremarkable.  Vascular/Lymphatic: No ascites or pneumoperitoneum. No abdominal or pelvic lymphadenopathy, by size criteria. No aneurysmal dilation of the major abdominal arteries. PICC 1  Reproductive: Normal size prostate. There is a subtle ill-defined hypoattenuating area in the peripheral portion of the right prostatic mid gland/apex region, incompletely characterized on the current exam. Correlate with serum PSA level to determine the need for additional imaging with MRI exam. Symmetric seminal vesicles.  Other:  There is a tiny fat containing umbilical hernia. The soft tissues and abdominal wall are otherwise unremarkable.  Musculoskeletal: No suspicious osseous lesions. There are mild multilevel degenerative changes in the visualized spine.  IMPRESSION: 1. 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 There is an approximately 1.2 x 1.2 cm hypoattenuating area in the uncinate process region, which may represent acute necrotic collection. No other peripancreatic collection. Correlate clinically. Findings may represent late acute versus subacute pancreatitis. 2. There is a subtle ill-defined hypoattenuating area in the peripheral portion of the right prostatic mid gland/apex region, incompletely characterized on the current exam. Correlate with serum PSA level to determine the need for additional imaging with MRI exam. 3. Multiple other nonacute observations, as described above. 4. 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  Electronically Signed   By: Beula Brunswick M.D.   On: 10/12/2023 12:41 DG Chest 1 View CLINICAL DATA:  Rule out pneumonia.  EXAM: CHEST  1 VIEW  COMPARISON:  Chest radiograph dated 10/09/2009.  FINDINGS: No focal consolidation, pleural effusion, or pneumothorax. The cardiac silhouette is within normal limits. No acute osseous pathology.  IMPRESSION: No active disease.  Electronically Signed   By: Angus Bark M.D.   On: 10/12/2023 10:48     Scheduled inpatient medications:   chlordiazePOXIDE  10 mg Oral QID   enoxaparin  (LOVENOX ) injection  40 mg Subcutaneous Q24H   Continuous inpatient infusions:   lactated ringers  125  mL/hr at 10/13/23 0947   piperacillin-tazobactam 3.375 g (10/13/23 1225)   PRN inpatient medications:  acetaminophen  **OR** acetaminophen , HYDROmorphone (DILAUDID) injection, ondansetron  (ZOFRAN ) IV, oxyCODONE , senna-docusate  Vital signs in last 24 hours: Temp:  [98 F (36.7 C)-99 F (37.2 C)] 98.5 F (36.9 C) (04/30 1148) Pulse Rate:  [51-76] 60 (04/30 1148) Resp:  [12-22] 16 (04/30 1148) BP: (105-134)/(57-90) 114/63 (04/30 1148) SpO2:  [96 %-100 %] 98 % (04/30 1148) Last BM Date :  (PTA)  Intake/Output Summary (Last 24 hours) at 10/13/2023 1230 Last data filed at 10/13/2023 0502 Gross per 24 hour  Intake 2343.13 ml  Output --  Net 2343.13 ml    Intake/Output from previous day: 04/29 0701 - 04/30 0700 In: 2343.1 [I.V.:2343.1] Out: -  Intake/Output this shift: No intake/output data recorded.   Physical Exam:  General: Alert male in NAD Heart:  Regular rate and rhythm.  Pulmonary: Normal respiratory effort Abdomen: Soft, nondistended, nontender. Normal bowel sounds. Extremities: No lower extremity edema  Neurologic: Alert and oriented Psych: Pleasant. Cooperative     LOS: 0 days   Mai Schwalbe ,NP 10/13/2023, 12:30 PM

## 2023-10-14 DIAGNOSIS — K8591 Acute pancreatitis with uninfected necrosis, unspecified: Secondary | ICD-10-CM | POA: Diagnosis not present

## 2023-10-14 LAB — PHOSPHORUS: Phosphorus: 3.8 mg/dL (ref 2.5–4.6)

## 2023-10-14 LAB — CBC
HCT: 38.5 % — ABNORMAL LOW (ref 39.0–52.0)
Hemoglobin: 13 g/dL (ref 13.0–17.0)
MCH: 33 pg (ref 26.0–34.0)
MCHC: 33.8 g/dL (ref 30.0–36.0)
MCV: 97.7 fL (ref 80.0–100.0)
Platelets: 164 10*3/uL (ref 150–400)
RBC: 3.94 MIL/uL — ABNORMAL LOW (ref 4.22–5.81)
RDW: 13.3 % (ref 11.5–15.5)
WBC: 9 10*3/uL (ref 4.0–10.5)
nRBC: 0 % (ref 0.0–0.2)

## 2023-10-14 LAB — COMPREHENSIVE METABOLIC PANEL WITH GFR
ALT: 19 U/L (ref 0–44)
AST: 16 U/L (ref 15–41)
Albumin: 2.9 g/dL — ABNORMAL LOW (ref 3.5–5.0)
Alkaline Phosphatase: 55 U/L (ref 38–126)
Anion gap: 11 (ref 5–15)
BUN: 5 mg/dL — ABNORMAL LOW (ref 6–20)
CO2: 24 mmol/L (ref 22–32)
Calcium: 8.8 mg/dL — ABNORMAL LOW (ref 8.9–10.3)
Chloride: 103 mmol/L (ref 98–111)
Creatinine, Ser: 0.84 mg/dL (ref 0.61–1.24)
GFR, Estimated: >60 mL/min (ref >60)
Glucose, Bld: 90 mg/dL (ref 70–99)
Potassium: 3.9 mmol/L (ref 3.5–5.1)
Sodium: 138 mmol/L (ref 135–145)
Total Bilirubin: 0.9 mg/dL (ref 0.0–1.2)
Total Protein: 6.8 g/dL (ref 6.5–8.1)

## 2023-10-14 LAB — MAGNESIUM: Magnesium: 2.1 mg/dL (ref 1.7–2.4)

## 2023-10-14 MED ORDER — LACTATED RINGERS IV SOLN: INTRAVENOUS | Status: DC

## 2023-10-14 NOTE — Progress Notes (Signed)
 Daily Progress Note  DOA: 10/12/2023 Hospital Day: 3   Chief Complaint: Necrotizing pancreatitis  ASSESSMENT     48 y.o. year old male with a history of Etoh use disorder, liver laceration from stable wounds in 2023. Admitted yesterday with abdominal pain , N/V and necrotizing pancreatitis. See 4/29 consult note.    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 Positive tobacco use. Normal triglycerides.  Not on any home medications , not hypercalcemic.  Not suspected to be biliary given normal LFTs and absence of biliary duct dilation on imaging Today:  Had recurrent pain last night after broth but today has consumed a significant amount of clear liquids without any recurrent pain .  Renal function remains normal, hematocrit 38%, normal white count   Intermittent rectal bleeding Hemoglobin seems to be normal.  It was 14 on admission, now at 12.9 but that was after aggressive IV fluid resuscitation.   PLAN   --Taking enough PO fluids so will stop IVF --Cautiously advancing to low fat diet --No need for antibiotics from GI standpoint.  -- Alcohol cessation -- Eventual repeat imaging of the pancreas (6 to 8 weeks) -- Needs eventual outpatient colonoscopy for age and intermittent rectal bleeding  Subjective   Had recurrent upper abdominal pain last night after broth.  However he has had a significant amount of p.o. fluids today without any recurrent pain.     Objective    Recent Labs    10/12/23 1035 10/13/23 0412 10/14/23 0505  WBC 13.3* 10.7* 9.0  HGB 14.2 12.9* 13.0  HCT 42.8 39.3 38.5*  MCV 98.8 99.0 97.7  PLT 174 165 164   No results for input(s): "FOLATE", "VITAMINB12", "FERRITIN", "TIBC", "IRONPCTSAT" in the last 72 hours. Recent Labs    10/12/23 1035 10/13/23 0412 10/14/23 0505  NA 135 135 138  K 4.1 3.9 3.9  CL 100 101 103  CO2 24 26 24   GLUCOSE 105* 71 90  BUN 7 7 5*  CREATININE 0.81 0.93 0.84  CALCIUM 8.9 8.6* 8.8*   Recent  Labs    10/12/23 1035 10/13/23 0412 10/14/23 0505  PROT 7.3 6.7 6.8  ALBUMIN 3.2* 2.9* 2.9*  AST 20 16 16   ALT 26 23 19   ALKPHOS 66 58 55  BILITOT 1.6* 1.2 0.9   No results for input(s): "INR" in the last 72 hours. No results for input(s): "AFPTUMOR" in the last 72 hours.   No results for input(s): "ANA" in the last 72 hours.  Imaging:  CT ABDOMEN PELVIS W CONTRAST CLINICAL DATA:  Abdominal pain, acute, nonlocalized upper abd pain, worse w any motion.  EXAM: CT ABDOMEN AND PELVIS WITH CONTRAST  TECHNIQUE: Multidetector CT imaging of the abdomen and pelvis was performed using the standard protocol following bolus administration of intravenous contrast.  RADIATION DOSE REDUCTION: This exam was performed according to the departmental dose-optimization program which includes automated exposure control, adjustment of the mA and/or kV according to patient size and/or use of iterative reconstruction technique.  CONTRAST:  75mL OMNIPAQUE  IOHEXOL  350 MG/ML SOLN  COMPARISON:  CT scan abdomen and pelvis from 08/19/2022.  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 The lung bases are otherwise clear. No pleural effusion. The heart is normal in size. No pericardial effusion.  Hepatobiliary: The liver is normal in size. Non-cirrhotic configuration. These is diffuse hepatic steatosis. No suspicious mass.  Note is made of at least 2, subcentimeter, ill-defined hypoattenuating foci in the right hepatic lobe, which are too small to adequately characterize but appears grossly unchanged since the prior study. No intrahepatic or extrahepatic bile duct dilation. No calcified gallstones. Normal gallbladder wall thickness. No pericholecystic inflammatory changes.  Pancreas: Since the prior study, there is  new heterogeneous and bulky appearance of pancreatic head/uncinate process. There is significant surrounding fat stranding. There are areas of the pancreatic head/uncinate process, which are hypoattenuating in comparison to the rest of the pancreas. There is also an ill-defined approximately 1.2 x 1.2 cm hypoattenuating area in the uncinate process region without discrete wall. In appropriate clinical settings findings are concerning for necrotizing pancreatitis. The above-mentioned 1.2 x 1.2 cm hypoattenuating area may represent acute necrotic collection. No other peripancreatic collection noted. Correlate clinically.  Rest of the pancreas is unremarkable. No focal mass. Main pancreatic duct is not dilated.  Spleen: Within normal limits. No focal lesion.  Adrenals/Urinary Tract: Adrenal glands are unremarkable. No suspicious renal mass. No hydronephrosis. No renal or ureteric calculi. Unremarkable urinary bladder.  Stomach/Bowel: There is tiny sliding hiatal hernia. There is mild asymmetric irregular thickening of the 2/3 part of duodenum, likely secondary to pancreatitis. No disproportionate dilation of the small or large bowel loops. No evidence of abnormal bowel wall thickening or inflammatory changes. The appendix is unremarkable.  Vascular/Lymphatic: No ascites or pneumoperitoneum. No abdominal or pelvic lymphadenopathy, by size criteria. No aneurysmal dilation of the major abdominal arteries. PICC 1  Reproductive: Normal size prostate. There is a subtle ill-defined hypoattenuating area in the peripheral portion of the right prostatic mid gland/apex region, incompletely characterized on the current exam. Correlate with serum PSA level to determine the need for additional imaging with MRI exam. Symmetric seminal vesicles.  Other: There is a tiny fat containing umbilical hernia. The soft tissues and abdominal wall are otherwise unremarkable.  Musculoskeletal: No suspicious  osseous lesions. There are mild multilevel degenerative changes in the visualized spine.  IMPRESSION: 1. 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 There is an approximately 1.2 x 1.2 cm hypoattenuating area in the uncinate process region, which may represent acute necrotic collection. No other peripancreatic collection. Correlate clinically. Findings may represent late acute versus subacute pancreatitis. 2. There is a subtle ill-defined hypoattenuating area in the peripheral portion of the right prostatic mid gland/apex region, incompletely characterized on the current exam. Correlate with serum PSA level to determine the need for additional imaging with MRI exam. 3. Multiple other nonacute observations, as described above. 4. 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  Electronically Signed   By: Beula Brunswick M.D.   On: 10/12/2023 12:41 DG Chest 1 View CLINICAL DATA:  Rule out pneumonia.  EXAM: CHEST  1 VIEW  COMPARISON:  Chest radiograph dated 10/09/2009.  FINDINGS: No focal consolidation, pleural effusion, or pneumothorax. The cardiac silhouette is within normal limits. No acute osseous pathology.  IMPRESSION: No active disease.  Electronically Signed   By: Angus Bark M.D.   On: 10/12/2023 10:48     Scheduled inpatient medications:   enoxaparin  (LOVENOX ) injection  40 mg Subcutaneous Q24H   Continuous inpatient infusions:   lactated ringers  Stopped (10/14/23 0430)   lactated ringers  75 mL/hr at 10/14/23 0914   piperacillin -tazobactam 3.375 g (10/14/23 1113)   PRN inpatient medications: acetaminophen  **OR** acetaminophen , HYDROmorphone  (DILAUDID ) injection, ondansetron  (ZOFRAN )  IV, oxyCODONE , senna-docusate  Vital signs in last 24  hours: Temp:  [97.7 F (36.5 C)-98.5 F (36.9 C)] 97.7 F (36.5 C) (05/01 0429) Pulse Rate:  [51-60] 51 (04/30 2358) Resp:  [16-18] 18 (05/01 0429) BP: (111-117)/(58-72) 117/66 (05/01 0429) SpO2:  [96 %-98 %] 98 % (04/30 2358) Last BM Date :  (PTA)  Intake/Output Summary (Last 24 hours) at 10/14/2023 1136 Last data filed at 10/14/2023 0300 Gross per 24 hour  Intake 1976.87 ml  Output --  Net 1976.87 ml    Intake/Output from previous day: 04/30 0701 - 05/01 0700 In: 1976.9 [P.O.:240; I.V.:1636.9; IV Piggyback:100] Out: -  Intake/Output this shift: No intake/output data recorded.   Physical Exam:  General: Alert male in NAD Heart:  Regular rate and rhythm.  Pulmonary: Normal respiratory effort Abdomen: Soft, nondistended, nontender. Normal bowel sounds. Extremities: No lower extremity edema  Neurologic: Alert and oriented Psych: Pleasant. Cooperative     LOS: 1 day   Mai Schwalbe ,NP 10/14/2023, 11:36 AM

## 2023-10-14 NOTE — Progress Notes (Signed)
 PROGRESS NOTE    Evan West  ZOX:[id number] DOB: 08/29/1975 DOA: 10/12/2023 PCP: System, Provider Not In    Chief Complaint  Patient presents with   Abdominal Pain    Brief Narrative:     Evan West is a 48 y.o. male with medical history significant of alcohol use disorder presenting to the ED with abdominal pain.  Workup significant for necrotizing pancreatitis, admitted for further workup.    Assessment & Plan:   Principal Problem:   Necrotizing pancreatitis Active Problems:   Abnormal CT scan, pelvis   Rectal bleeding  Necrotizing pancreatitis -Due to alcohol abuse, no evidence of gallstones, and triglycerides within normal limits - DC IV Zosyn  given he is afebrile with no leukocytosis -.  Significant pain overnight after broth, but he is improving today, so management per GI, diet has been advanced, will hold IV fluids. -Continue with as needed pain regimen   Subtle ill-defined hypoattenuating area in the peripheral portion of the right prostatic mid gland/apex region - Noted on CT imaging - PSA within normal limit -Will need follow-up with urology as an outpatient   Hemorrhoids Patient with known hemorrhoids. Does report intermittent BRBPR mainly noted on toilet paper with wiping, chronic. Hgb normal and stable.    Alcohol use disorder Alcoholic fatty liver disease -notes daily alcohol use ranging from one drink to about five 40oz drinks per day -no history of alcohol withdrawal per patient -placed on CIWA without ativan  for now, I have offered patient scheduled Librium  to prevent withdrawals, but for now he is adamant about not receiving any anxiolytics, will monitor closely - Alcoholic fatty liver disease noted on CT imaging    DVT prophylaxis: Lovenox  Code Status: Full code Family Communication: None at bedside Disposition:   Status is: Inpatient    Consultants:  GI   Subjective: Patient reports significant amount of  epigastric pain yesterday after drinking broth, she was kept on clear liquid diet, IV fluids, but later in the afternoon he was seen by GI, however his p.o. has picked up and tolerating clear liquid diet, so it was advanced.  Objective: Vitals:   10/13/23 2024 10/13/23 2358 10/14/23 0429 10/14/23 1144  BP: (!) 116/58 111/72 117/66 117/71  Pulse: (!) 56 (!) 51    Resp:  17 18 18   Temp: 98.3 F (36.8 C) 98.5 F (36.9 C) 97.7 F (36.5 C) 98 F (36.7 C)  TempSrc: Oral Oral Oral Oral  SpO2: 96% 98%  94%  Weight:      Height:        Intake/Output Summary (Last 24 hours) at 10/14/2023 1407 Last data filed at 10/14/2023 0300 Gross per 24 hour  Intake 1976.87 ml  Output --  Net 1976.87 ml   Filed Weights   10/12/23 0904  Weight: 88.5 kg    Examination:  Awake Alert, Oriented X 3, No new F.N deficits, Normal affect Symmetrical Chest wall movement, Good air movement bilaterally, CTAB RRR,No Gallops,Rubs or new Murmurs, No Parasternal Heave +ve B.Sounds, Abd Soft, No tenderness, No rebound - guarding or rigidity. No Cyanosis, Clubbing or edema, No new Rash or bruise       Data Reviewed: I have personally reviewed following labs and imaging studies  CBC: Recent Labs  Lab 10/12/23 1035 10/13/23 0412 10/14/23 0505  WBC 13.3* 10.7* 9.0  NEUTROABS 8.5*  --   --   HGB 14.2 12.9* 13.0  HCT 42.8 39.3 38.5*  MCV 98.8 99.0 97.7  PLT 174 165 164  Basic Metabolic Panel: Recent Labs  Lab 10/12/23 1035 10/13/23 0412 10/14/23 0505  NA 135 135 138  K 4.1 3.9 3.9  CL 100 101 103  CO2 24 26 24   GLUCOSE 105* 71 90  BUN 7 7 5*  CREATININE 0.81 0.93 0.84  CALCIUM 8.9 8.6* 8.8*  MG  --   --  2.1  PHOS  --   --  3.8    GFR: Estimated Creatinine Clearance: 115.5 mL/min (by C-G formula based on SCr of 0.84 mg/dL).  Liver Function Tests: Recent Labs  Lab 10/12/23 1035 10/13/23 0412 10/14/23 0505  AST 20 16 16   ALT 26 23 19   ALKPHOS 66 58 55  BILITOT 1.6* 1.2 0.9  PROT  7.3 6.7 6.8  ALBUMIN 3.2* 2.9* 2.9*    CBG: No results for input(s): "GLUCAP" in the last 168 hours.   No results found for this or any previous visit (from the past 240 hours).       Radiology Studies: No results found.       Scheduled Meds:  enoxaparin  (LOVENOX ) injection  40 mg Subcutaneous Q24H   Continuous Infusions:     LOS: 1 day       Seena Dadds, MD [facility]   To contact the attending provider between 7A-7P or the covering provider during after hours 7P-7A, please log into the web site [email or web address] and access using universal [place] password for that web site. If you do not have the password, please call the hospital operator.  10/14/2023, 2:07 PM

## 2023-10-14 NOTE — Plan of Care (Signed)

## 2023-10-15 DIAGNOSIS — K8591 Acute pancreatitis with uninfected necrosis, unspecified: Secondary | ICD-10-CM | POA: Diagnosis not present

## 2023-10-15 LAB — CBC
HCT: 38.4 % — ABNORMAL LOW (ref 39.0–52.0)
Hemoglobin: 12.8 g/dL — ABNORMAL LOW (ref 13.0–17.0)
MCH: 32.5 pg (ref 26.0–34.0)
MCHC: 33.3 g/dL (ref 30.0–36.0)
MCV: 97.5 fL (ref 80.0–100.0)
Platelets: 186 10*3/uL (ref 150–400)
RBC: 3.94 MIL/uL — ABNORMAL LOW (ref 4.22–5.81)
RDW: 13.4 % (ref 11.5–15.5)
WBC: 7.3 10*3/uL (ref 4.0–10.5)
nRBC: 0 % (ref 0.0–0.2)

## 2023-10-15 LAB — COMPREHENSIVE METABOLIC PANEL WITH GFR
ALT: 18 U/L (ref 0–44)
AST: 15 U/L (ref 15–41)
Albumin: 2.9 g/dL — ABNORMAL LOW (ref 3.5–5.0)
Alkaline Phosphatase: 49 U/L (ref 38–126)
Anion gap: 9 (ref 5–15)
BUN: 5 mg/dL — ABNORMAL LOW (ref 6–20)
CO2: 24 mmol/L (ref 22–32)
Calcium: 8.7 mg/dL — ABNORMAL LOW (ref 8.9–10.3)
Chloride: 106 mmol/L (ref 98–111)
Creatinine, Ser: 0.78 mg/dL (ref 0.61–1.24)
GFR, Estimated: >60 mL/min (ref >60)
Glucose, Bld: 100 mg/dL — ABNORMAL HIGH (ref 70–99)
Potassium: 4 mmol/L (ref 3.5–5.1)
Sodium: 139 mmol/L (ref 135–145)
Total Bilirubin: 0.7 mg/dL (ref 0.0–1.2)
Total Protein: 6.7 g/dL (ref 6.5–8.1)

## 2023-10-15 NOTE — Progress Notes (Signed)
 PROGRESS NOTE    Evan West  WUJ:[id number] DOB: 1976/03/15 DOA: 10/12/2023 PCP: System, Provider Not In    Chief Complaint  Patient presents with   Abdominal Pain    Brief Narrative:     Evan West is a 48 y.o. male with medical history significant of alcohol use disorder presenting to the ED with abdominal pain.  Workup significant for necrotizing pancreatitis, admitted for further workup.    Assessment & Plan:   Principal Problem:   Necrotizing pancreatitis Active Problems:   Abnormal CT scan, pelvis   Rectal bleeding  Necrotizing pancreatitis -Due to alcohol abuse, no evidence of gallstones, and triglycerides within normal limits - DC IV Zosyn  given he is afebrile with no leukocytosis . -Is advanced to heart healthy, but he did vomit after breakfast, after lunch, will downgrade his diet back to full liquid. -Started on as needed Zofran  for nausea and vomiting. -Continue with as needed pain regimen   Subtle ill-defined hypoattenuating area in the peripheral portion of the right prostatic mid gland/apex region - Noted on CT imaging - PSA within normal limit -Will need follow-up with urology as an outpatient   Hemorrhoids Patient with known hemorrhoids. Does report intermittent BRBPR mainly noted on toilet paper with wiping, chronic. Hgb normal and stable.    Alcohol use disorder Alcoholic fatty liver disease -notes daily alcohol use ranging from one drink to about five 40oz drinks per day -no history of alcohol withdrawal per patient -placed on CIWA without ativan  for now, I have offered patient scheduled Librium  to prevent withdrawals, but for now he is adamant about not receiving any anxiolytics, will monitor closely - Alcoholic fatty liver disease noted on CT imaging    DVT prophylaxis: Lovenox  Code Status: Full code Family Communication: None at bedside Disposition:   Status is: Inpatient    Consultants:   GI   Subjective: Patient did vomit his breakfast, unfortunately also afternoon he did vomit as well,.  Objective: Vitals:   10/14/23 2032 10/15/23 0001 10/15/23 0828 10/15/23 1213  BP: (!) 144/56 115/65 125/66 117/78  Pulse:    (!) 58  Resp:   20 20  Temp: 98.7 F (37.1 C) 98.4 F (36.9 C) 98.4 F (36.9 C) 98.6 F (37 C)  TempSrc: Oral Oral Oral Oral  SpO2:      Weight:      Height:       No intake or output data in the 24 hours ending 10/15/23 1616  Filed Weights   10/12/23 0904  Weight: 88.5 kg    Examination:  Awake Alert, Oriented X 3, No new F.N deficits, Normal affect Symmetrical Chest wall movement, Good air movement bilaterally, CTAB RRR,No Gallops,Rubs or new Murmurs, No Parasternal Heave +ve B.Sounds, Abd Soft, No tenderness, No rebound - guarding or rigidity. No Cyanosis, Clubbing or edema, No new Rash or bruise       Data Reviewed: I have personally reviewed following labs and imaging studies  CBC: Recent Labs  Lab 10/12/23 1035 10/13/23 0412 10/14/23 0505 10/15/23 0523  WBC 13.3* 10.7* 9.0 7.3  NEUTROABS 8.5*  --   --   --   HGB 14.2 12.9* 13.0 12.8*  HCT 42.8 39.3 38.5* 38.4*  MCV 98.8 99.0 97.7 97.5  PLT 174 165 164 186    Basic Metabolic Panel: Recent Labs  Lab 10/12/23 1035 10/13/23 0412 10/14/23 0505 10/15/23 0523  NA 135 135 138 139  K 4.1 3.9 3.9 4.0  CL 100 101 103  106  CO2 24 26 24 24   GLUCOSE 105* 71 90 100*  BUN 7 7 5* 5*  CREATININE 0.81 0.93 0.84 0.78  CALCIUM 8.9 8.6* 8.8* 8.7*  MG  --   --  2.1  --   PHOS  --   --  3.8  --     GFR: Estimated Creatinine Clearance: 121.3 mL/min (by C-G formula based on SCr of 0.78 mg/dL).  Liver Function Tests: Recent Labs  Lab 10/12/23 1035 10/13/23 0412 10/14/23 0505 10/15/23 0523  AST 20 16 16 15   ALT 26 23 19 18   ALKPHOS 66 58 55 49  BILITOT 1.6* 1.2 0.9 0.7  PROT 7.3 6.7 6.8 6.7  ALBUMIN 3.2* 2.9* 2.9* 2.9*    CBG: No results for input(s): "GLUCAP" in the  last 168 hours.   No results found for this or any previous visit (from the past 240 hours).       Radiology Studies: No results found.       Scheduled Meds:  enoxaparin  (LOVENOX ) injection  40 mg Subcutaneous Q24H   Continuous Infusions:     LOS: 2 days       Seena Dadds, MD [facility]   To contact the attending provider between 7A-7P or the covering provider during after hours 7P-7A, please log into the web site [email or web address] and access using universal [place] password for that web site. If you do not have the password, please call the hospital operator.  10/15/2023, 4:16 PM

## 2023-10-15 NOTE — Care Plan (Signed)
 This RN called into patients room because he was upset about full liquid tray. Pt angry this RN told his medical team about his vomiting after lunch. Pt cursed and used offensive language to state that he would not eat anything until he leaves. He demanded his diet be changed back. Pt educated on why team recommended full liquid diet, but pt non-compliant.

## 2023-10-15 NOTE — Progress Notes (Signed)
 Daily Progress Note  DOA: 10/12/2023 Hospital Day: 4   Chief Complaint: Necrotizing pancreatitis  ASSESSMENT    Brief history 48 y.o. year old male with a history of Etoh use disorder, liver laceration from stable wounds in 2023. Admitted yesterday with abdominal pain , N/V and necrotizing pancreatitis. See 4/29 consult note.   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  Today:  Diet was advanced to low-fat yesterday .  Tolerated a sandwich last night.  This morning he vomited breakfast ([place] sausage, hashbrowns ) but did not really have any associated abdominal pain and abdomen is nontender.     Intermittent rectal bleeding Hemoglobin seems to be normal.  It was 14 on admission, now at 12.9 but that was after aggressive IV fluid resuscitation.  Principal Problem:   Necrotizing pancreatitis Active Problems:   Abnormal CT scan, pelvis   Rectal bleeding   PLAN   --Unclear why he vomited after breakfast this morning.  Not getting narcotics . Thankfully he did not have a recurrence of abdominal pain after eating .  At this point will not de-escalate diet.  Lets see how he does with lunch.  I advised him to pick more bland foods and avoid fried foods for lunch.  Further recommendations after evaluation of how he tolerated lunch -- Alcohol cessation -- Eventual repeat imaging of the pancreas (6 to 8 weeks) -- Needs eventual outpatient colonoscopy for age and intermittent rectal bleeding   Subjective    Tolerated sandwich last night for dinner but vomited breakfast this a.m..  Not having any abdominal pain.  He is having bowel movements   Objective   GI Studies:    Recent Labs    10/13/23 0412 10/14/23 0505 10/15/23 0523  WBC 10.7* 9.0 7.3  HGB 12.9* 13.0 12.8*  HCT 39.3 38.5* 38.4*  MCV 99.0 97.7 97.5  PLT 165 164 186   No results for input(s): "FOLATE", "VITAMINB12", "FERRITIN", "TIBC", "IRONPCTSAT" in the last 72 hours. Recent Labs     10/13/23 0412 10/14/23 0505 10/15/23 0523  NA 135 138 139  K 3.9 3.9 4.0  CL 101 103 106  CO2 26 24 24   GLUCOSE 71 90 100*  BUN 7 5* 5*  CREATININE 0.93 0.84 0.78  CALCIUM 8.6* 8.8* 8.7*   Recent Labs    10/13/23 0412 10/14/23 0505 10/15/23 0523  PROT 6.7 6.8 6.7  ALBUMIN 2.9* 2.9* 2.9*  AST 16 16 15   ALT 23 19 18   ALKPHOS 58 55 49  BILITOT 1.2 0.9 0.7   Imaging:  CT ABDOMEN PELVIS W CONTRAST CLINICAL DATA:  Abdominal pain, acute, nonlocalized upper abd pain, worse w any motion.  EXAM: CT ABDOMEN AND PELVIS WITH CONTRAST  TECHNIQUE: Multidetector CT imaging of the abdomen and pelvis was performed using the standard protocol following bolus administration of intravenous contrast.  RADIATION DOSE REDUCTION: This exam was performed according to the departmental dose-optimization program which includes automated exposure control, adjustment of the mA and/or kV according to patient size and/or use of iterative reconstruction technique.  CONTRAST:  75mL OMNIPAQUE  IOHEXOL  350 MG/ML SOLN  COMPARISON:  CT scan abdomen and pelvis from 08/19/2022.  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 The lung bases are otherwise clear. No pleural effusion. The heart is normal in size. No pericardial effusion.  Hepatobiliary: The liver is normal in size. Non-cirrhotic  configuration. These is diffuse hepatic steatosis. No suspicious mass. Note is made of at least 2, subcentimeter, ill-defined hypoattenuating foci in the right hepatic lobe, which are too small to adequately characterize but appears grossly unchanged since the prior study. No intrahepatic or extrahepatic bile duct dilation. No calcified gallstones. Normal gallbladder wall thickness. No pericholecystic inflammatory  changes.  Pancreas: Since the prior study, there is new heterogeneous and bulky appearance of pancreatic head/uncinate process. There is significant surrounding fat stranding. There are areas of the pancreatic head/uncinate process, which are hypoattenuating in comparison to the rest of the pancreas. There is also an ill-defined approximately 1.2 x 1.2 cm hypoattenuating area in the uncinate process region without discrete wall. In appropriate clinical settings findings are concerning for necrotizing pancreatitis. The above-mentioned 1.2 x 1.2 cm hypoattenuating area may represent acute necrotic collection. No other peripancreatic collection noted. Correlate clinically.  Rest of the pancreas is unremarkable. No focal mass. Main pancreatic duct is not dilated.  Spleen: Within normal limits. No focal lesion.  Adrenals/Urinary Tract: Adrenal glands are unremarkable. No suspicious renal mass. No hydronephrosis. No renal or ureteric calculi. Unremarkable urinary bladder.  Stomach/Bowel: There is tiny sliding hiatal hernia. There is mild asymmetric irregular thickening of the 2/3 part of duodenum, likely secondary to pancreatitis. No disproportionate dilation of the small or large bowel loops. No evidence of abnormal bowel wall thickening or inflammatory changes. The appendix is unremarkable.  Vascular/Lymphatic: No ascites or pneumoperitoneum. No abdominal or pelvic lymphadenopathy, by size criteria. No aneurysmal dilation of the major abdominal arteries. PICC 1  Reproductive: Normal size prostate. There is a subtle ill-defined hypoattenuating area in the peripheral portion of the right prostatic mid gland/apex region, incompletely characterized on the current exam. Correlate with serum PSA level to determine the need for additional imaging with MRI exam. Symmetric seminal vesicles.  Other: There is a tiny fat containing umbilical hernia. The soft tissues and abdominal wall are  otherwise unremarkable.  Musculoskeletal: No suspicious osseous lesions. There are mild multilevel degenerative changes in the visualized spine.  IMPRESSION: 1. 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 There is an approximately 1.2 x 1.2 cm hypoattenuating area in the uncinate process region, which may represent acute necrotic collection. No other peripancreatic collection. Correlate clinically. Findings may represent late acute versus subacute pancreatitis. 2. There is a subtle ill-defined hypoattenuating area in the peripheral portion of the right prostatic mid gland/apex region, incompletely characterized on the current exam. Correlate with serum PSA level to determine the need for additional imaging with MRI exam. 3. Multiple other nonacute observations, as described above. 4. 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  Electronically Signed   By: Beula Brunswick M.D.   On: 10/12/2023 12:41 DG Chest 1 View CLINICAL DATA:  Rule out pneumonia.  EXAM: CHEST  1 VIEW  COMPARISON:  Chest radiograph dated 10/09/2009.  FINDINGS: No focal consolidation, pleural effusion, or pneumothorax. The cardiac silhouette is within normal limits. No acute osseous pathology.  IMPRESSION: No active disease.  Electronically Signed   By: Angus Bark M.D.   On: 10/12/2023 10:48     Scheduled inpatient medications:   enoxaparin  (LOVENOX ) injection  40 mg Subcutaneous Q24H   Continuous inpatient infusions:  PRN inpatient medications: acetaminophen  **OR** acetaminophen , HYDROmorphone  (DILAUDID ) injection, ondansetron  (ZOFRAN ) IV, oxyCODONE , senna-docusate  Vital signs in last 24 hours: Temp:  [98 F (36.7  C)-98.9 F (37.2 C)] 98.4 F (36.9 C) (05/02  0828) Pulse Rate:  [55] 55 (05/01 1715) Resp:  [18-20] 20 (05/02 0828) BP: (108-144)/(56-71) 125/66 (05/02 0828) SpO2:  [93 %-94 %] 93 % (05/01 1715) Last BM Date :  (PTA)  Intake/Output Summary (Last 24 hours) at 10/15/2023 1031 Last data filed at 10/14/2023 1200 Gross per 24 hour  Intake 240 ml  Output --  Net 240 ml    Intake/Output from previous day: 05/01 0701 - 05/02 0700 In: 480 [P.O.:480] Out: -  Intake/Output this shift: No intake/output data recorded.   Physical Exam:  General: Alert male in NAD Heart:  Regular rate and rhythm.  Pulmonary: Normal respiratory effort Abdomen: Soft, nondistended, nontender. Normal bowel sounds. Extremities: No lower extremity edema  Neurologic: Alert and oriented Psych: Pleasant. Cooperative     LOS: 2 days   Mai Schwalbe ,NP 10/15/2023, 10:31 AM

## 2023-10-15 NOTE — TOC Progression Note (Signed)
 Transition of Care [facility]) - Progression Note    Patient Details  Name: Evan West MRN: [id number] Date of Birth: 12-18-1975  [facility] CM/SW Contact  Eusebio High, RN Phone Number: 10/15/2023, 9:31 AM  Clinical Narrative:     Chart Review- PCP has been requested and will be arranged- Do not anticipate any additional TOC needs          Expected Discharge Plan and Services                                               Social Determinants of Health (SDOH) Interventions SDOH Screenings   Food Insecurity: No Food Insecurity (10/12/2023)  Housing: Low Risk  (10/12/2023)  Transportation Needs: No Transportation Needs (10/12/2023)  Utilities: Not At Risk (10/12/2023)  Tobacco Use: High Risk (10/12/2023)    Readmission Risk Interventions     No data to display

## 2023-10-15 NOTE — Plan of Care (Signed)
  Problem: Education: Goal: Knowledge of General Education information will improve Description Including pain rating scale, medication(s)/side effects and non-pharmacologic comfort measures Outcome: Progressing   Problem: Clinical Measurements: Goal: Will remain free from infection Outcome: Progressing Goal: Diagnostic test results will improve Outcome: Progressing   Problem: Coping: Goal: Level of anxiety will decrease Outcome: Progressing   Problem: Safety: Goal: Ability to remain free from injury will improve Outcome: Progressing

## 2023-10-15 NOTE — Plan of Care (Signed)

## 2023-10-16 DIAGNOSIS — K8591 Acute pancreatitis with uninfected necrosis, unspecified: Secondary | ICD-10-CM | POA: Diagnosis not present

## 2023-10-16 LAB — COMPREHENSIVE METABOLIC PANEL WITH GFR
ALT: 24 U/L (ref 0–44)
AST: 23 U/L (ref 15–41)
Albumin: 3 g/dL — ABNORMAL LOW (ref 3.5–5.0)
Alkaline Phosphatase: 50 U/L (ref 38–126)
Anion gap: 7 (ref 5–15)
BUN: 6 mg/dL (ref 6–20)
CO2: 27 mmol/L (ref 22–32)
Calcium: 8.8 mg/dL — ABNORMAL LOW (ref 8.9–10.3)
Chloride: 105 mmol/L (ref 98–111)
Creatinine, Ser: 0.8 mg/dL (ref 0.61–1.24)
GFR, Estimated: >60 mL/min (ref >60)
Glucose, Bld: 105 mg/dL — ABNORMAL HIGH (ref 70–99)
Potassium: 4.1 mmol/L (ref 3.5–5.1)
Sodium: 139 mmol/L (ref 135–145)
Total Bilirubin: 0.8 mg/dL (ref 0.0–1.2)
Total Protein: 7 g/dL (ref 6.5–8.1)

## 2023-10-16 LAB — CBC
HCT: 41.4 % (ref 39.0–52.0)
Hemoglobin: 13.4 g/dL (ref 13.0–17.0)
MCH: 31.9 pg (ref 26.0–34.0)
MCHC: 32.4 g/dL (ref 30.0–36.0)
MCV: 98.6 fL (ref 80.0–100.0)
Platelets: 213 10*3/uL (ref 150–400)
RBC: 4.2 MIL/uL — ABNORMAL LOW (ref 4.22–5.81)
RDW: 13.4 % (ref 11.5–15.5)
WBC: 8.4 10*3/uL (ref 4.0–10.5)
nRBC: 0 % (ref 0.0–0.2)

## 2023-10-16 MED ORDER — FOLIC ACID 1 MG PO TABS: 1.0000 mg | ORAL_TABLET | Freq: Every day | ORAL | Status: DC

## 2023-10-16 MED ORDER — THIAMINE HCL 100 MG PO TABS: 100.0000 mg | ORAL_TABLET | Freq: Every day | ORAL | Status: DC

## 2023-10-16 NOTE — Plan of Care (Signed)
  Problem: Education: Goal: Knowledge of General Education information will improve Description: Including pain rating scale, medication(s)/side effects and non-pharmacologic comfort measures Outcome: Not Progressing   Problem: Clinical Measurements: Goal: Respiratory complications will improve Outcome: Progressing   

## 2023-10-16 NOTE — Plan of Care (Signed)

## 2023-10-16 NOTE — Discharge Summary (Signed)
 Physician Discharge Summary  Evan West HQI:[id number] DOB: 1976/03/23 DOA: 10/12/2023  PCP: System, Provider Not In  Admit date: 10/12/2023 Discharge date: 10/16/2023  Admitted From: (Home) Disposition:  (Home )  Recommendations for Outpatient Follow-up:  Continue counseling about total alcohol abstinence Patient will need routine screening colonoscopy, instructed to call [facility] to schedule an appointment Ambulatory referral has been sent to pulmonary for follow-up regarding abnormal finding on his CT scan suspicious for interstitial lung disease [facility] will arrange for outpatient follow-up regarding abnormal finding on prostate on imaging, discussed with on-call resident Dr. Hilliard Loyal who will arrange for the office to call him on Monday for follow-up   Diet recommendation: Heart Healthy   Brief/Interim Summary:   Evan West is a 48 y.o. male with medical history significant of alcohol use disorder presenting to the ED with abdominal pain.  Workup significant for necrotizing pancreatitis, admitted for further workup.   Necrotizing pancreatitis -Due to alcohol abuse, no evidence of gallstones, and triglycerides within normal limits, he was admitted and started on IV antibiotics, IV fluids, he had improvement in his pain, he was started on clear liquid diet initially, tolerated well advance to heart healthy, could not tolerate, but was adamant about not regressing his diet, so he was monitored another 24 hours where he has been tolerating his heart healthy diet over last 24 hours with no nausea, vomiting or abdominal pain, he will be discharged with instruction to continue with heart healthy diet and total alcohol abstinence.  Subtle ill-defined hypoattenuating area in the peripheral portion of the right prostatic mid gland/apex region - Noted on CT imaging - PSA within normal limit -Will need follow-up with urology as an outpatient, discussed with urology  resident on-call, Dr. Hilliard Loyal will have [facility] call them on Monday for a follow-up appointment   Hemorrhoids Patient with known hemorrhoids. Does report intermittent BRBPR mainly noted on toilet paper with wiping, chronic. Hgb normal and stable.     Alcohol use disorder Alcoholic fatty liver disease -notes daily alcohol use ranging from one drink to about five 40oz drinks per day -no history of alcohol withdrawal per patient -placed on CIWA without ativan  for now, I have offered patient scheduled Librium  to prevent withdrawals, but for now he is adamant about not receiving any anxiolytics, will monitor closely - Alcoholic fatty liver disease noted on CT imaging  Groundglass opacity finding in lung on CT scan -with questionable ILD, ambulatory referral has been sent to pulmonary to arrange for follow-up  Discussed with the patient, importance to call  GI to arrange for outpatient GI colonoscopy, and understand the importance of the follow-up regarding his abnormal findings in prostate, and lung on his scan.     Discharge Diagnoses:  Principal Problem:   Necrotizing pancreatitis Active Problems:   Abnormal CT scan, pelvis   Rectal bleeding    Discharge Instructions  Discharge Instructions     Diet - low sodium heart healthy   Complete by: As directed    Discharge instructions   Complete by: As directed    Avoid alcohol, and continue with heart healthy diet, avoid fatty and greasy food   Increase activity slowly   Complete by: As directed    Pulmonary Visit   Complete by: As directed    CT abdomen findings with concern of ILD.   Reason for referral: ILD/Pulmonary Fibrosis (IPF)      Allergies as of 10/16/2023   No Known Allergies  Medication List     TAKE these medications    folic acid 1 MG tablet Commonly known as: FOLVITE Take 1 tablet (1 mg total) by mouth daily.   thiamine 100 MG tablet Commonly known as: VITAMIN B1 Take 1 tablet (100  mg total) by mouth daily.        Follow-up Information     Gavin Kast, FNP Follow up.   Specialty: Internal Medicine Why: TIME : 1:20 PM   PLEASE ARRIVE AT 1:00 PM DATE :  JUNE  11 , 2025  PLEASE BRING ALL MEDICATION , ID and INS CARD Contact information: [address] [place] [address] [phone]                No Known Allergies  Consultations: Gastroenterology   Procedures/Studies: CT ABDOMEN PELVIS W CONTRAST Result Date: 10/12/2023 CLINICAL DATA:  Abdominal pain, acute, nonlocalized upper abd pain, worse w any motion. EXAM: CT ABDOMEN AND PELVIS WITH CONTRAST TECHNIQUE: Multidetector CT imaging of the abdomen and pelvis was performed using the standard protocol following bolus administration of intravenous contrast. RADIATION DOSE REDUCTION: This exam was performed according to the departmental dose-optimization program which includes automated exposure control, adjustment of the mA and/or kV according to patient size and/or use of iterative reconstruction technique. CONTRAST:  75mL OMNIPAQUE  IOHEXOL  350 MG/ML SOLN COMPARISON:  CT scan abdomen and pelvis from 08/19/2022. 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 The lung bases are otherwise clear. No pleural effusion. The heart is normal in size. No pericardial effusion. Hepatobiliary: The liver is normal in size. Non-cirrhotic configuration. These is diffuse hepatic steatosis. No suspicious mass. Note is made of at least 2, subcentimeter, ill-defined hypoattenuating foci in the right hepatic lobe, which are too small to adequately characterize but appears grossly unchanged since the prior study. No intrahepatic or extrahepatic bile duct dilation. No calcified gallstones. Normal gallbladder wall thickness. No pericholecystic  inflammatory changes. Pancreas: Since the prior study, there is new heterogeneous and bulky appearance of pancreatic head/uncinate process. There is significant surrounding fat stranding. There are areas of the pancreatic head/uncinate process, which are hypoattenuating in comparison to the rest of the pancreas. There is also an ill-defined approximately 1.2 x 1.2 cm hypoattenuating area in the uncinate process region without discrete wall. In appropriate clinical settings findings are concerning for necrotizing pancreatitis. The above-mentioned 1.2 x 1.2 cm hypoattenuating area may represent acute necrotic collection. No other peripancreatic collection noted. Correlate clinically. Rest of the pancreas is unremarkable. No focal mass. Main pancreatic duct is not dilated. Spleen: Within normal limits. No focal lesion. Adrenals/Urinary Tract: Adrenal glands are unremarkable. No suspicious renal mass. No hydronephrosis. No renal or ureteric calculi. Unremarkable urinary bladder. Stomach/Bowel: There is tiny sliding hiatal hernia. There is mild asymmetric irregular thickening of the 2/3 part of duodenum, likely secondary to pancreatitis. No disproportionate dilation of the small or large bowel loops. No evidence of abnormal bowel wall thickening or inflammatory changes. The appendix is unremarkable. Vascular/Lymphatic: No ascites or pneumoperitoneum. No abdominal or pelvic lymphadenopathy, by size criteria. No aneurysmal dilation of the major abdominal arteries. PICC 1 Reproductive: Normal size prostate. There is a subtle ill-defined hypoattenuating area in the peripheral portion of the right prostatic mid gland/apex region, incompletely characterized on the current exam. Correlate with serum PSA level to determine the need for additional imaging with MRI exam. Symmetric seminal vesicles.  Other: There is a tiny fat containing umbilical hernia. The soft tissues and abdominal wall are otherwise unremarkable.  Musculoskeletal: No suspicious osseous lesions. There are mild multilevel degenerative changes in the visualized spine. IMPRESSION: 1. 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 There is an approximately 1.2 x 1.2 cm hypoattenuating area in the uncinate process region, which may represent acute necrotic collection. No other peripancreatic collection. Correlate clinically. Findings may represent late acute versus subacute pancreatitis. 2. There is a subtle ill-defined hypoattenuating area in the peripheral portion of the right prostatic mid gland/apex region, incompletely characterized on the current exam. Correlate with serum PSA level to determine the need for additional imaging with MRI exam. 3. Multiple other nonacute observations, as described above. 4. 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 Electronically Signed   By: Beula Brunswick M.D.   On: 10/12/2023 12:41   DG Chest 1 View Result Date: 10/12/2023 CLINICAL DATA:  Rule out pneumonia. EXAM: CHEST  1 VIEW COMPARISON:  Chest radiograph dated 10/09/2009. FINDINGS: No focal consolidation, pleural effusion, or pneumothorax. The cardiac silhouette is within normal limits. No acute osseous pathology. IMPRESSION: No active disease. Electronically Signed   By: Angus Bark M.D.   On: 10/12/2023 10:48     Subjective: Has any complaints today, tolerated his heart healthy diet during breakfast and lunch, no nausea, no vomiting, no abdominal pain  Discharge Exam: Vitals:   10/16/23 0545 10/16/23 1305  BP: 110/77 (!) 105/56  Pulse:    Resp:    Temp: 98.2 F (36.8 C) 98.6 F (37 C)  SpO2:  99%   Vitals:   10/15/23 2007 10/15/23 2328 10/16/23 0545 10/16/23 1305  BP: 118/62 101/83 110/77 (!) 105/56  Pulse:      Resp:       Temp: 98.6 F (37 C) 98.5 F (36.9 C) 98.2 F (36.8 C) 98.6 F (37 C)  TempSrc: Oral Oral Oral Oral  SpO2:    99%  Weight:      Height:        General: Pt is alert, awake, not in acute distress Cardiovascular: RRR, S1/S2 +, no rubs, no gallops Respiratory: CTA bilaterally, no wheezing, no rhonchi Abdominal: Soft, NT, ND, bowel sounds + Extremities: no edema, no cyanosis    The results of significant diagnostics from this hospitalization (including imaging, microbiology, ancillary and laboratory) are listed below for reference.     Microbiology: No results found for this or any previous visit (from the past 240 hours).   Labs: BNP (last 3 results) No results for input(s): "BNP" in the last 8760 hours. Basic Metabolic Panel: Recent Labs  Lab 10/12/23 1035 10/13/23 0412 10/14/23 0505 10/15/23 0523 10/16/23 0452  NA 135 135 138 139 139  K 4.1 3.9 3.9 4.0 4.1  CL 100 101 103 106 105  CO2 24 26 24 24 27   GLUCOSE 105* 71 90 100* 105*  BUN 7 7 5* 5* 6  CREATININE 0.81 0.93 0.84 0.78 0.80  CALCIUM 8.9 8.6* 8.8* 8.7* 8.8*  MG  --   --  2.1  --   --   PHOS  --   --  3.8  --   --    Liver Function Tests: Recent Labs  Lab 10/12/23 1035 10/13/23 0412 10/14/23 0505 10/15/23 0523 10/16/23 0452  AST 20 16 16 15 23   ALT 26 23 19  18 24  ALKPHOS 66 58 55 49 50  BILITOT 1.6* 1.2 0.9 0.7 0.8  PROT 7.3 6.7 6.8 6.7 7.0  ALBUMIN 3.2* 2.9* 2.9* 2.9* 3.0*   Recent Labs  Lab 10/12/23 1035 10/13/23 0412  LIPASE 51 85*   No results for input(s): "AMMONIA" in the last 168 hours. CBC: Recent Labs  Lab 10/12/23 1035 10/13/23 0412 10/14/23 0505 10/15/23 0523 10/16/23 0452  WBC 13.3* 10.7* 9.0 7.3 8.4  NEUTROABS 8.5*  --   --   --   --   HGB 14.2 12.9* 13.0 12.8* 13.4  HCT 42.8 39.3 38.5* 38.4* 41.4  MCV 98.8 99.0 97.7 97.5 98.6  PLT 174 165 164 186 213   Cardiac Enzymes: No results for input(s): "CKTOTAL", "CKMB", "CKMBINDEX", "TROPONINI" in the last 168  hours. BNP: Invalid input(s): "POCBNP" CBG: No results for input(s): "GLUCAP" in the last 168 hours. D-Dimer No results for input(s): "DDIMER" in the last 72 hours. Hgb A1c No results for input(s): "HGBA1C" in the last 72 hours. Lipid Profile No results for input(s): "CHOL", "HDL", "LDLCALC", "TRIG", "CHOLHDL", "LDLDIRECT" in the last 72 hours. Thyroid function studies No results for input(s): "TSH", "T4TOTAL", "T3FREE", "THYROIDAB" in the last 72 hours.  Invalid input(s): "FREET3" Anemia work up No results for input(s): "VITAMINB12", "FOLATE", "FERRITIN", "TIBC", "IRON", "RETICCTPCT" in the last 72 hours. Urinalysis    Component Value Date/Time   COLORURINE STRAW (A) 08/19/2022 1420   APPEARANCEUR CLEAR 08/19/2022 1420   LABSPEC 1.003 (L) 08/19/2022 1420   PHURINE 6.0 08/19/2022 1420   GLUCOSEU NEGATIVE 08/19/2022 1420   HGBUR SMALL (A) 08/19/2022 1420   BILIRUBINUR NEGATIVE 08/19/2022 1420   KETONESUR NEGATIVE 08/19/2022 1420   PROTEINUR NEGATIVE 08/19/2022 1420   UROBILINOGEN 0.2 07/15/2014 1822   NITRITE NEGATIVE 08/19/2022 1420   LEUKOCYTESUR NEGATIVE 08/19/2022 1420   Sepsis Labs Recent Labs  Lab 10/13/23 0412 10/14/23 0505 10/15/23 0523 10/16/23 0452  WBC 10.7* 9.0 7.3 8.4   Microbiology No results found for this or any previous visit (from the past 240 hours).   Time coordinating discharge: Over 30 minutes  SIGNED:   Seena Dadds, MD  [facility] 10/16/2023, 4:48 PM Pager   If 7PM-7AM, please contact night-coverage [email or web address] Password [email or web address]

## 2023-10-16 NOTE — Progress Notes (Signed)
 Reviewed AVS, patient expressed understanding of medications, MD follow up reviewed.   Removed IV, Site clean, dry and intact.  Patient states all belongings brought to the hospital at time of admission are accounted for and packed to take home.  Patient informed and expressed understanding of where to get prescribed medications.  Pt transported to Discharge lounge to wait for transportation home.

## 2023-10-16 NOTE — Discharge Instructions (Signed)
 Avoid alcohol, and continue with heart healthy diet, avoid fatty and greasy food

## 2023-10-31 ENCOUNTER — Other Ambulatory Visit: Payer: Self-pay

## 2023-10-31 ENCOUNTER — Emergency Department (HOSPITAL_COMMUNITY)

## 2023-10-31 ENCOUNTER — Encounter (HOSPITAL_COMMUNITY): Payer: Self-pay

## 2023-10-31 ENCOUNTER — Emergency Department (HOSPITAL_COMMUNITY)
Admission: EM | Admit: 2023-10-31 | Discharge: 2023-10-31 | Disposition: A | Discharge status: Home / Self Care | Attending: Emergency Medicine | Admitting: Emergency Medicine

## 2023-10-31 DIAGNOSIS — K860 Alcohol-induced chronic pancreatitis: Secondary | ICD-10-CM | POA: Diagnosis not present

## 2023-10-31 DIAGNOSIS — R1012 Left upper quadrant pain: Secondary | ICD-10-CM

## 2023-10-31 DIAGNOSIS — R1013 Epigastric pain: Secondary | ICD-10-CM | POA: Diagnosis present

## 2023-10-31 DIAGNOSIS — Y900 Blood alcohol level of less than 20 mg/100 ml: Secondary | ICD-10-CM | POA: Insufficient documentation

## 2023-10-31 LAB — URINALYSIS, ROUTINE W REFLEX MICROSCOPIC
Bilirubin Urine: NEGATIVE
Glucose, UA: NEGATIVE mg/dL
Hgb urine dipstick: NEGATIVE
Ketones, ur: NEGATIVE mg/dL
Leukocytes,Ua: NEGATIVE
Nitrite: NEGATIVE
Protein, ur: NEGATIVE mg/dL
Specific Gravity, Urine: 1.015 (ref 1.005–1.030)
pH: 6 (ref 5.0–8.0)

## 2023-10-31 LAB — CBC
HCT: 41.4 % (ref 39.0–52.0)
Hemoglobin: 14.1 g/dL (ref 13.0–17.0)
MCH: 32.7 pg (ref 26.0–34.0)
MCHC: 34.1 g/dL (ref 30.0–36.0)
MCV: 96.1 fL (ref 80.0–100.0)
Platelets: 241 10*3/uL (ref 150–400)
RBC: 4.31 MIL/uL (ref 4.22–5.81)
RDW: 13.5 % (ref 11.5–15.5)
WBC: 8.7 10*3/uL (ref 4.0–10.5)
nRBC: 0 % (ref 0.0–0.2)

## 2023-10-31 LAB — COMPREHENSIVE METABOLIC PANEL WITH GFR
ALT: 16 U/L (ref 0–44)
AST: 17 U/L (ref 15–41)
Albumin: 3.1 g/dL — ABNORMAL LOW (ref 3.5–5.0)
Alkaline Phosphatase: 64 U/L (ref 38–126)
Anion gap: 9 (ref 5–15)
BUN: <5 mg/dL — ABNORMAL LOW (ref 6–20)
CO2: 23 mmol/L (ref 22–32)
Calcium: 8.6 mg/dL — ABNORMAL LOW (ref 8.9–10.3)
Chloride: 107 mmol/L (ref 98–111)
Creatinine, Ser: 0.79 mg/dL (ref 0.61–1.24)
GFR, Estimated: >60 mL/min (ref >60)
Glucose, Bld: 100 mg/dL — ABNORMAL HIGH (ref 70–99)
Potassium: 3.6 mmol/L (ref 3.5–5.1)
Sodium: 139 mmol/L (ref 135–145)
Total Bilirubin: 0.5 mg/dL (ref 0.0–1.2)
Total Protein: 6.7 g/dL (ref 6.5–8.1)

## 2023-10-31 LAB — ETHANOL: Alcohol, Ethyl (B): <15 mg/dL (ref <15)

## 2023-10-31 LAB — LIPASE, BLOOD: Lipase: 47 U/L (ref 11–51)

## 2023-10-31 MED ORDER — IOHEXOL 350 MG/ML SOLN
75.0000 mL | Freq: Once | INTRAVENOUS | Status: AC | PRN
  Administered 2023-10-31: 75 mL via INTRAVENOUS

## 2023-10-31 MED ORDER — FAMOTIDINE IN NACL 20-0.9 MG/50ML-% IV SOLN
20.0000 mg | Freq: Once | INTRAVENOUS | Status: AC
  Administered 2023-10-31: 20 mg via INTRAVENOUS
  Filled 2023-10-31: qty 50

## 2023-10-31 MED ORDER — LIDOCAINE VISCOUS HCL 2 % MT SOLN
15.0000 mL | Freq: Once | OROMUCOSAL | Status: AC
  Administered 2023-10-31: 15 mL via ORAL
  Filled 2023-10-31: qty 15

## 2023-10-31 MED ORDER — ALUM & MAG HYDROXIDE-SIMETH 200-200-20 MG/5ML PO SUSP
30.0000 mL | Freq: Once | ORAL | Status: AC
  Administered 2023-10-31: 30 mL via ORAL
  Filled 2023-10-31: qty 30

## 2023-10-31 MED ORDER — HYDROMORPHONE HCL 1 MG/ML IJ SOLN
1.0000 mg | Freq: Once | INTRAMUSCULAR | Status: AC
  Administered 2023-10-31: 1 mg via INTRAVENOUS
  Filled 2023-10-31: qty 1

## 2023-10-31 MED ORDER — MORPHINE SULFATE (PF) 4 MG/ML IV SOLN
4.0000 mg | Freq: Once | INTRAVENOUS | Status: AC
  Administered 2023-10-31: 4 mg via INTRAVENOUS
  Filled 2023-10-31: qty 1
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
## 2023-10-31 NOTE — Discharge Instructions (Addendum)
 Please use Tylenol  or ibuprofen for pain.  You may use 600 mg ibuprofen every 6 hours or 1000 mg of Tylenol  every 6 hours.  You may choose to alternate between the 2.  This would be most effective.  Not to exceed 4 g of Tylenol  within 24 hours.  Not to exceed 3200 mg ibuprofen 24 hours.  Please drink plenty of fluids, continue to not drink any alcohol, follow-up closely with the GI doctor, call the number provided above to schedule a follow-up appointment.

## 2023-10-31 NOTE — ED Triage Notes (Signed)
 Pt came in via POV d/t a flare-up from his pancreatitis. States that if walking or breathing it hurts in his upper abd. A/Ox4, rates his pain 6/10, denies n/v/d.

## 2023-10-31 NOTE — ED Provider Notes (Signed)
 Accepted handoff at shift change from Johana Soto PA-C. Please see prior provider note for more detail.   Briefly: Patient is 48 y.o.   DDX: concern for Pancreatitis flare, no focal tenderness on exam, not really in pain, low clinical suspicion for abscess, pending CT can go home  Plan: Some improvement of pain on reassessment, I spoke with [facility] who after reviewing his CT today versus his 1 during his hospitalization despite findings consistent with developing pseudocyst not significant change in the appearance from recent.  Given no white count, no fever, likely stable for outpatient follow up. Tolerating PO at time of discharge.     Nelly Banco, PA-C 10/31/23 1708    Afton Horse T, DO 11/01/23 2248

## 2023-11-01 ENCOUNTER — Encounter: Payer: Self-pay | Admitting: Physician Assistant

## 2023-11-01 ENCOUNTER — Telehealth: Payer: Self-pay | Admitting: Physician Assistant

## 2023-11-24 ENCOUNTER — Ambulatory Visit: Payer: PRIVATE HEALTH INSURANCE | Admitting: Internal Medicine
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
## 2023-12-20 ENCOUNTER — Ambulatory Visit: Payer: PRIVATE HEALTH INSURANCE | Admitting: Physician Assistant

## 2023-12-30 IMAGING — MR MR HEAD W/O CM
12 of 13 series · 44 of 48 positions shown · non-contrast
Comparison: None.

CLINICAL DATA: Dizziness

EXAM:
MRI HEAD WITHOUT CONTRAST
TECHNIQUE: Multiplanar, multiecho pulse sequences of the brain and surrounding
structures were obtained without intravenous contrast.

[Series 5: DWI · axial · 3.0mm · 0.96mm/px · z∈[-100,+71]mm · 7 of 117 slices shown (1 of 4)]
[im 1/117]
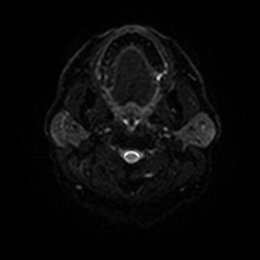
[im 20/117]
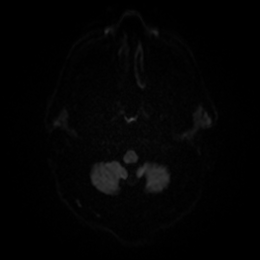
[im 39/117]
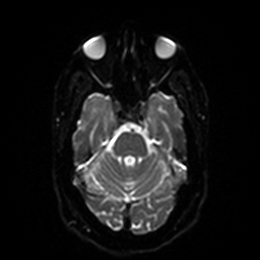
[im 59/117]
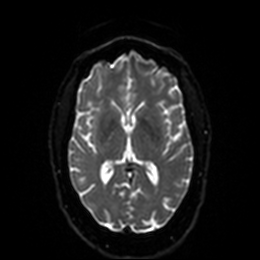
[im 78/117]
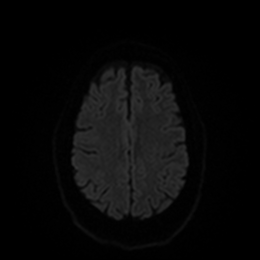
[im 97/117]
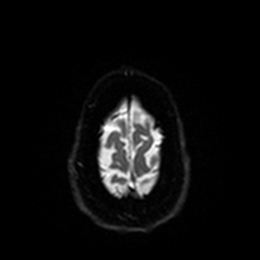
[im 117/117]
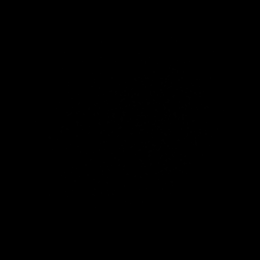

[Series 6: DWI · axial · 3.0mm · 0.96mm/px · z∈[-100,+68]mm · 3 of 58 slices shown (2 of 4)]
[im 1/58]
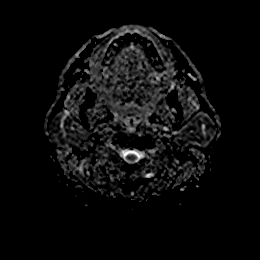
[im 29/58]
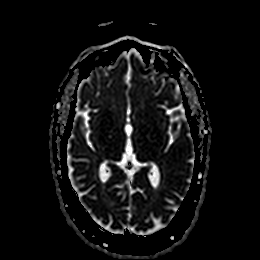
[im 58/58]
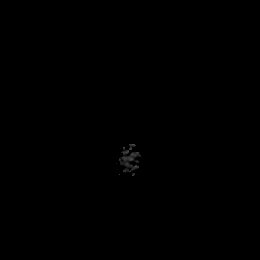

[Series 7: DWI · coronal · 4.0mm · 0.88mm/px · 6 of 88 slices shown (3 of 4)]
[im 1/88]
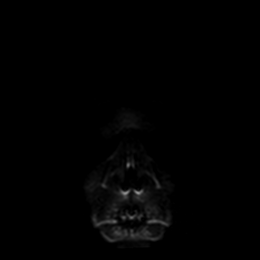
[im 18/88]
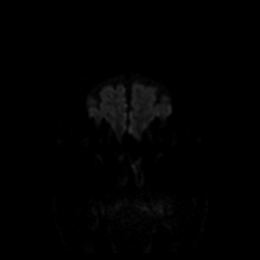
[im 35/88]
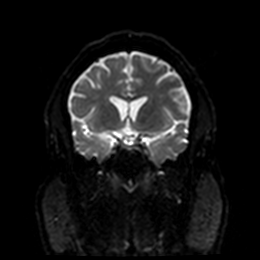
[im 53/88]
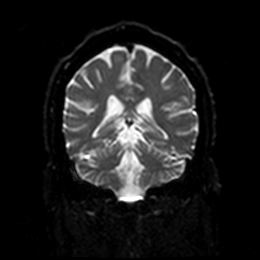
[im 70/88]
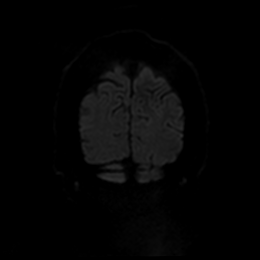
[im 88/88]
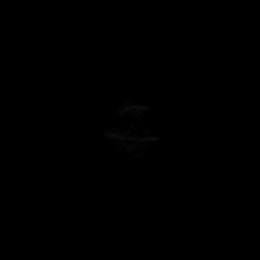

[Series 8: DWI · coronal · 4.0mm · 0.88mm/px · 3 of 44 slices shown (4 of 4)]
[im 1/44]
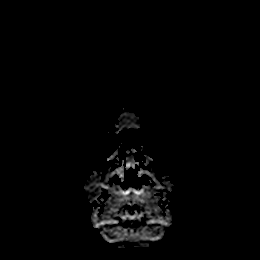
[im 22/44]
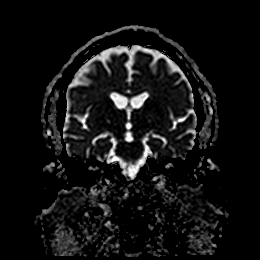
[im 44/44]
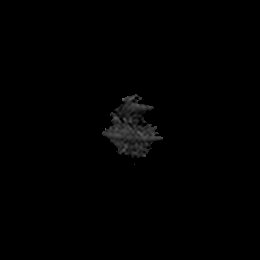

[Series 9: T1 · sagittal · 5.0mm · 0.78mm/px · 2 of 29 slices shown]
[im 1/29]
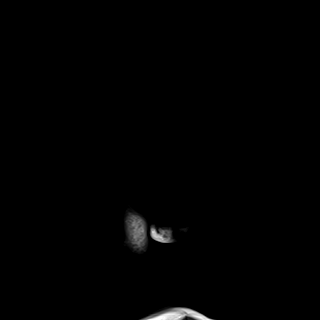
[im 29/29]
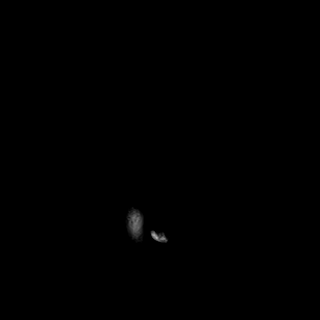

[Series 10: T2 · axial · 5.0mm · 0.78mm/px · z∈[-108,+61]mm · 2 of 30 slices shown (1 of 2)]
[im 1/30]
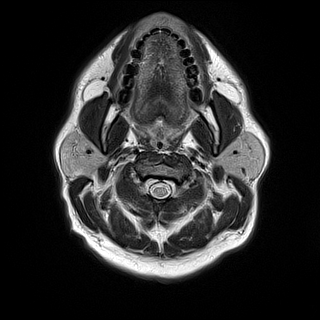
[im 30/30]
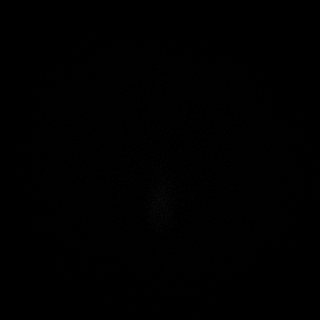

[Series 11: FLAIR · axial · 5.0mm · 0.49mm/px · z∈[-108,+61]mm · 2 of 30 slices shown]
[im 1/30]
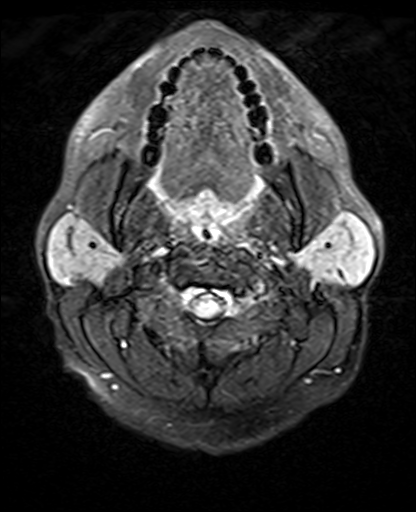
[im 30/30]
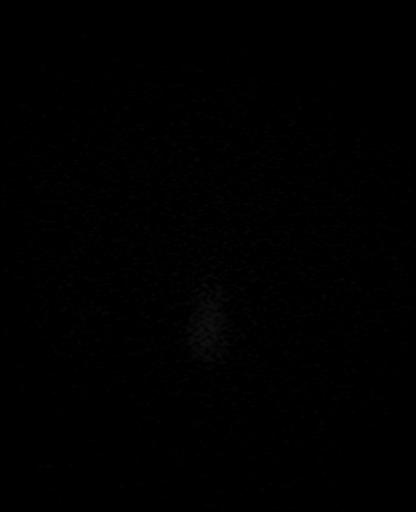

[Series 12: mag_images · axial · 3.0mm · 0.98mm/px · z∈[-101,+72]mm · 4 of 60 slices shown]
[im 1/60]
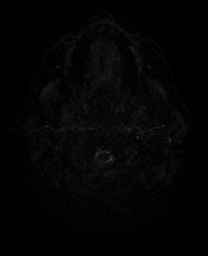
[im 20/60]
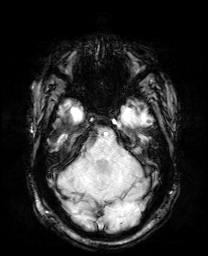
[im 40/60]
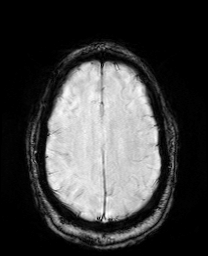
[im 60/60]
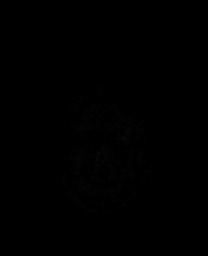

[Series 13: pha_images · axial · 3.0mm · 0.98mm/px · z∈[-101,+72]mm · 4 of 60 slices shown]
[im 1/60]
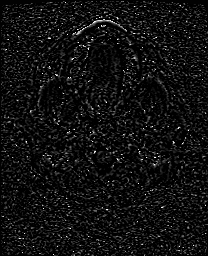
[im 20/60]
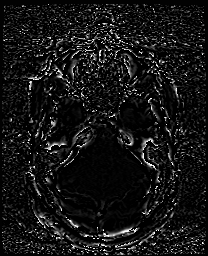
[im 40/60]
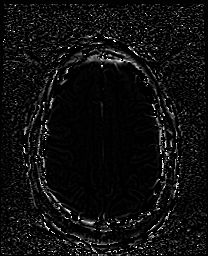
[im 60/60]
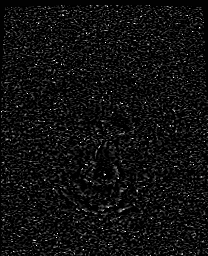

[Series 14: swi_images · axial · 3.0mm · 0.98mm/px · z∈[-101,+72]mm · 4 of 60 slices shown]
[im 1/60]
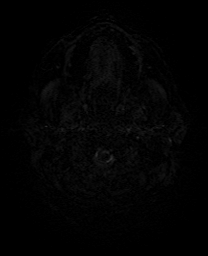
[im 20/60]
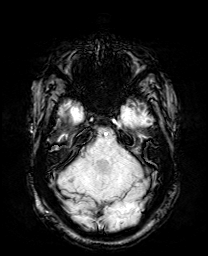
[im 40/60]
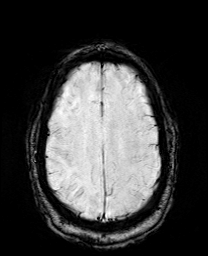
[im 60/60]
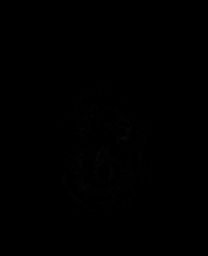

[Series 15: mip_images(sw) · axial · 24.0mm · 0.98mm/px · z∈[-91,+62]mm · 4 of 53 slices shown]
[im 1/53]
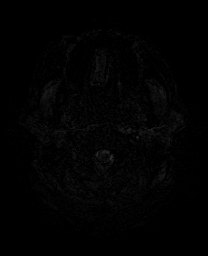
[im 18/53]
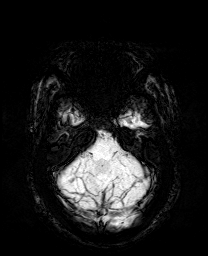
[im 35/53]
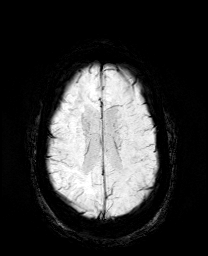
[im 53/53]
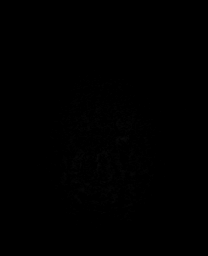

[Series 17: T2 · coronal · 5.0mm · 0.34mm/px · 3 of 38 slices shown (2 of 2)]
[im 1/38]
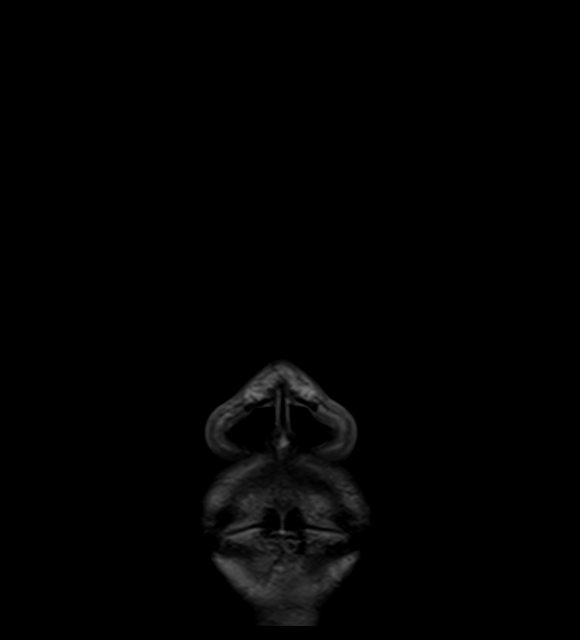
[im 19/38]
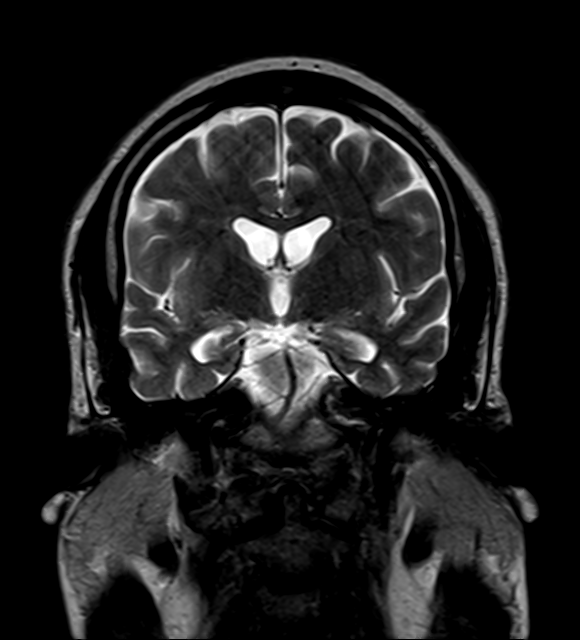
[im 38/38]
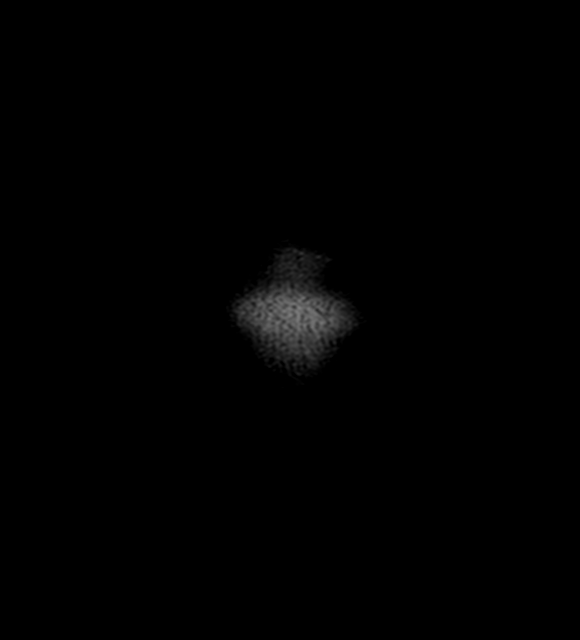

[44 of 48 positions shown; findings below may reference images not displayed]

FINDINGS: Brain: No acute infarct, mass effect or extra-axial collection. No
acute or chronic hemorrhage. Normal white matter signal, parenchymal
volume and CSF spaces. The midline structures are normal.

Vascular: Major flow voids are preserved.

Skull and upper cervical spine: Normal calvarium and skull base.
Visualized upper cervical spine and soft tissues are normal.

Sinuses/Orbits:No paranasal sinus fluid levels or advanced mucosal
thickening. No mastoid or middle ear effusion. Normal orbits.
IMPRESSION: Normal brain MRI.

## 2023-12-30 IMAGING — CT CT HEAD W/O CM
3 series · 15 of 47 positions shown, 18 images · non-contrast
Comparison: None.

CLINICAL DATA: Dizziness unable to stand.  Concern for stroke



[Series 3: head 5.0 h30s · axial · 0.45mm/px · z∈[+8,+143]mm · 9 of 33 slices shown, 12 images]
[im 3/33  brain]
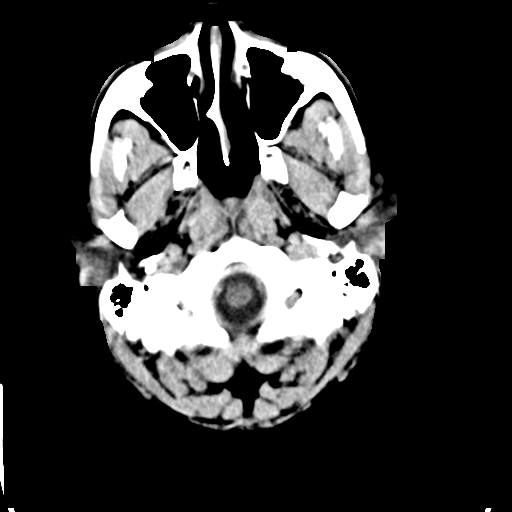
[im 3/33  bone]
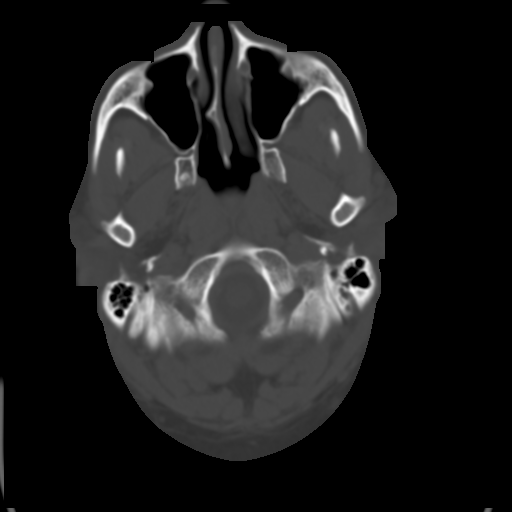
[im 6/33  brain]
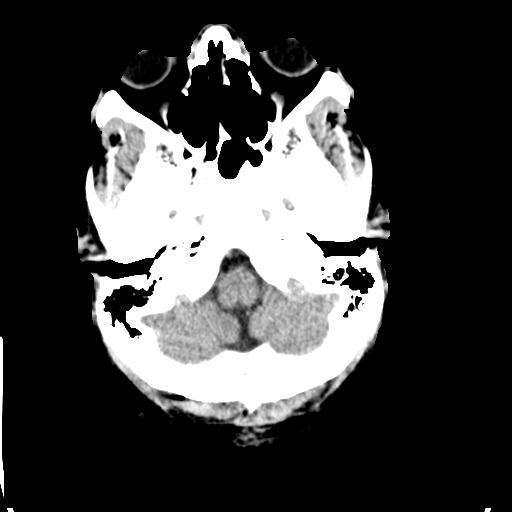
[im 9/33  brain]
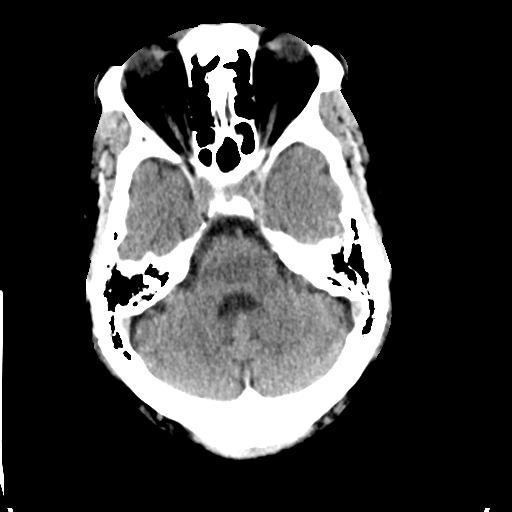
[im 13/33  brain]
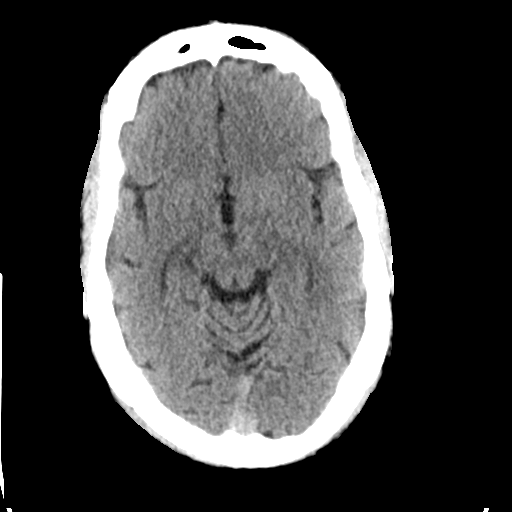
[im 17/33  brain]
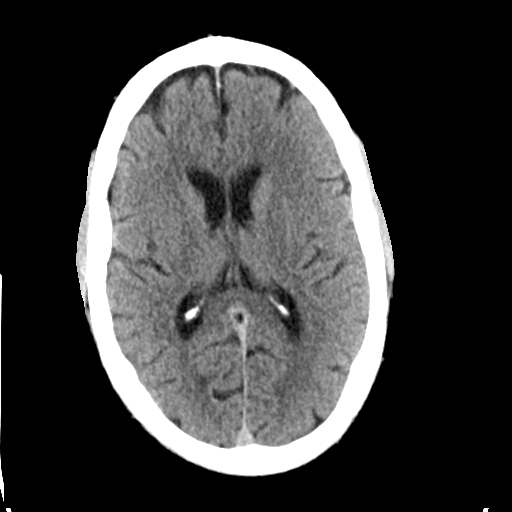
[im 17/33  bone]
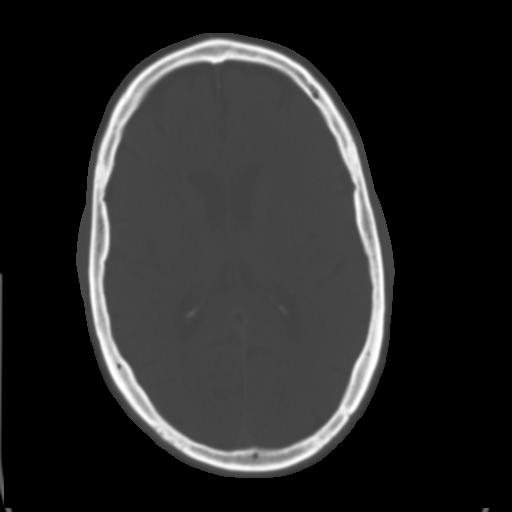
[im 20/33  brain]
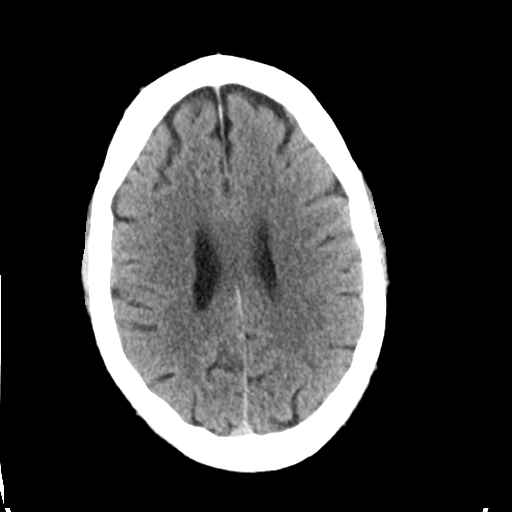
[im 24/33  brain]
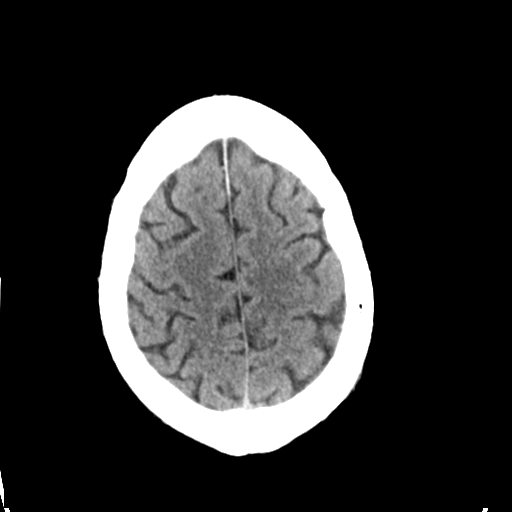
[im 27/33  brain]
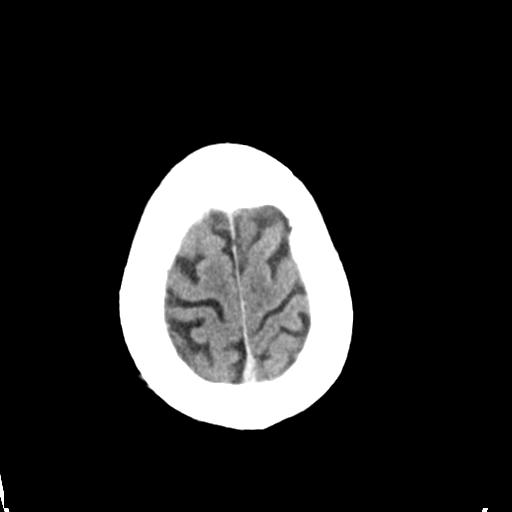
[im 30/33  brain]
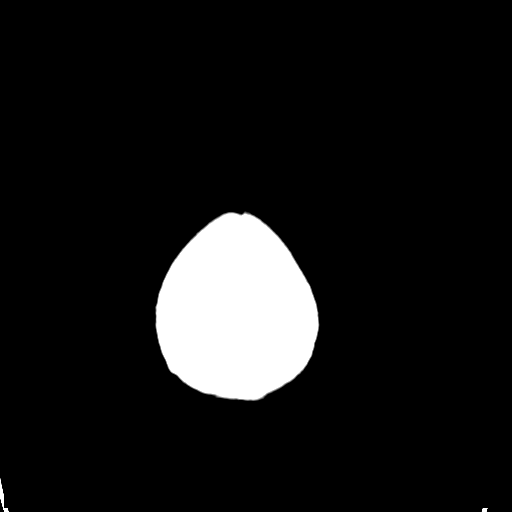
[im 30/33  bone]
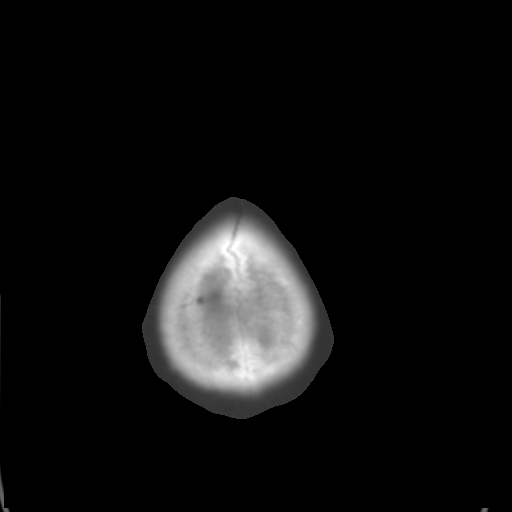

[Series 5: head 3.0 mpr cor · coronal · 0.33mm/px · 3 of 74 slices shown]
[im 25/74  brain]
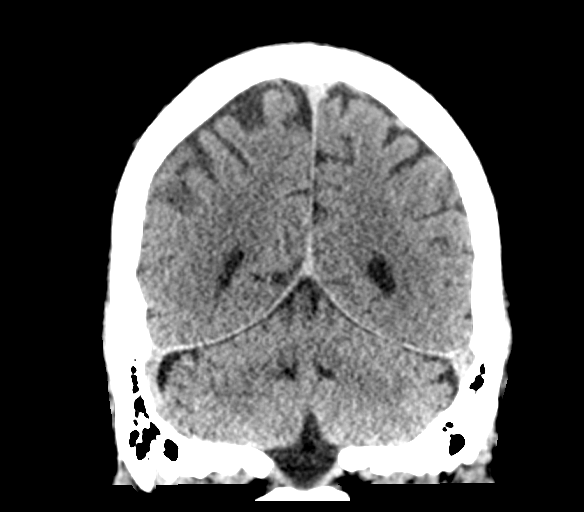
[im 33/74  brain]
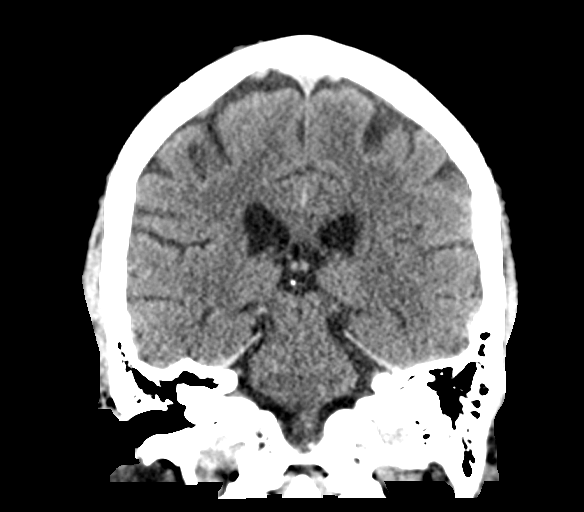
[im 41/74  brain]
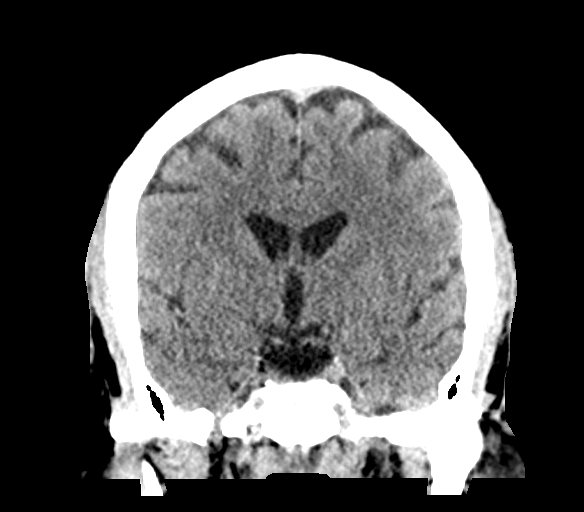

[Series 6: head 3.0 mpr sag · sagittal · 0.32mm/px · 3 of 66 slices shown]
[im 22/66  brain]
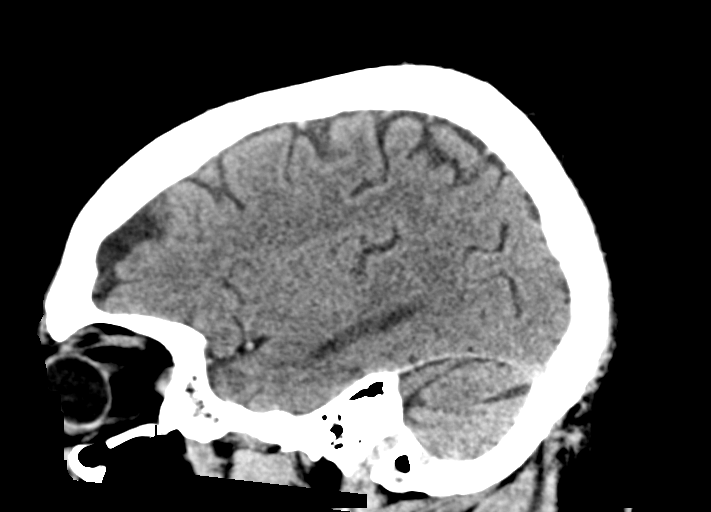
[im 33/66  brain]
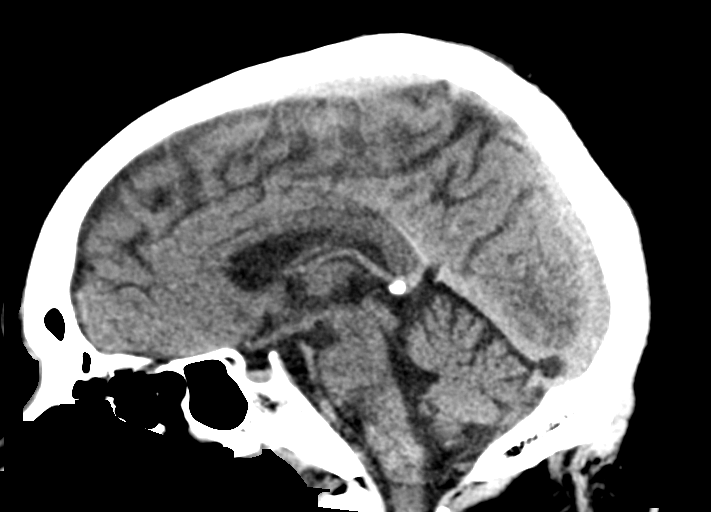
[im 44/66  brain]
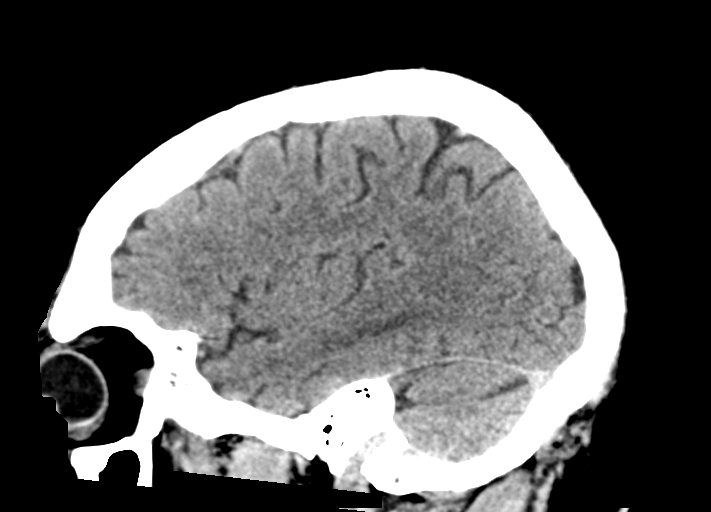

[15 of 47 positions shown; findings below may reference images not displayed]

FINDINGS: Brain: No evidence of acute infarction, hemorrhage, hydrocephalus,
extra-axial collection or mass lesion/mass effect.

Vascular: No hyperdense vessel or unexpected calcification.

Skull: Normal. Negative for fracture or focal lesion.

Sinuses/Orbits: Visualized portions of the paranasal sinuses are
predominantly clear. Orbits are grossly unremarkable.

Other: Mastoid air cells are predominantly clear.
IMPRESSION: No acute intracranial abnormality.

## 2024-01-20 ENCOUNTER — Emergency Department (HOSPITAL_COMMUNITY)

## 2024-01-20 ENCOUNTER — Encounter (HOSPITAL_COMMUNITY): Payer: Self-pay

## 2024-01-20 ENCOUNTER — Other Ambulatory Visit: Payer: Self-pay

## 2024-01-20 ENCOUNTER — Inpatient Hospital Stay (HOSPITAL_COMMUNITY)
Admission: EM | Admit: 2024-01-20 | Discharge: 2024-01-22 | DRG: 439 | Disposition: A | Discharge status: Home / Self Care | Attending: Family Medicine | Admitting: Family Medicine

## 2024-01-20 DIAGNOSIS — K8521 Alcohol induced acute pancreatitis with uninfected necrosis: Secondary | ICD-10-CM | POA: Diagnosis not present

## 2024-01-20 DIAGNOSIS — K8591 Acute pancreatitis with uninfected necrosis, unspecified: Secondary | ICD-10-CM | POA: Diagnosis not present

## 2024-01-20 DIAGNOSIS — K852 Alcohol induced acute pancreatitis without necrosis or infection: Principal | ICD-10-CM

## 2024-01-20 DIAGNOSIS — F1090 Alcohol use, unspecified, uncomplicated: Secondary | ICD-10-CM | POA: Insufficient documentation

## 2024-01-20 DIAGNOSIS — K859 Acute pancreatitis without necrosis or infection, unspecified: Secondary | ICD-10-CM | POA: Diagnosis not present

## 2024-01-20 DIAGNOSIS — K863 Pseudocyst of pancreas: Secondary | ICD-10-CM | POA: Diagnosis present

## 2024-01-20 DIAGNOSIS — E871 Hypo-osmolality and hyponatremia: Secondary | ICD-10-CM | POA: Diagnosis present

## 2024-01-20 DIAGNOSIS — F1010 Alcohol abuse, uncomplicated: Secondary | ICD-10-CM | POA: Diagnosis present

## 2024-01-20 DIAGNOSIS — Z833 Family history of diabetes mellitus: Secondary | ICD-10-CM

## 2024-01-20 DIAGNOSIS — F17210 Nicotine dependence, cigarettes, uncomplicated: Secondary | ICD-10-CM | POA: Diagnosis present

## 2024-01-20 HISTORY — DX: Acute pancreatitis with uninfected necrosis, unspecified: K85.91

## 2024-01-20 LAB — URINALYSIS, ROUTINE W REFLEX MICROSCOPIC
Bilirubin Urine: NEGATIVE
Glucose, UA: NEGATIVE mg/dL
Ketones, ur: 5 mg/dL — AB
Leukocytes,Ua: NEGATIVE
Nitrite: NEGATIVE
Protein, ur: NEGATIVE mg/dL
Specific Gravity, Urine: 1.02 (ref 1.005–1.030)
pH: 6 (ref 5.0–8.0)

## 2024-01-20 LAB — COMPREHENSIVE METABOLIC PANEL WITH GFR
ALT: 25 U/L (ref 0–44)
AST: 29 U/L (ref 15–41)
Albumin: 3.6 g/dL (ref 3.5–5.0)
Alkaline Phosphatase: 86 U/L (ref 38–126)
Anion gap: 9 (ref 5–15)
BUN: 6 mg/dL (ref 6–20)
CO2: 25 mmol/L (ref 22–32)
Calcium: 9 mg/dL (ref 8.9–10.3)
Chloride: 98 mmol/L (ref 98–111)
Creatinine, Ser: 0.89 mg/dL (ref 0.61–1.24)
GFR, Estimated: >60 mL/min (ref >60)
Glucose, Bld: 110 mg/dL — ABNORMAL HIGH (ref 70–99)
Potassium: 4 mmol/L (ref 3.5–5.1)
Sodium: 132 mmol/L — ABNORMAL LOW (ref 135–145)
Total Bilirubin: 1.2 mg/dL (ref 0.0–1.2)
Total Protein: 7.8 g/dL (ref 6.5–8.1)

## 2024-01-20 LAB — CBC WITH DIFFERENTIAL/PLATELET
Abs Immature Granulocytes: 0.04 K/uL (ref 0.00–0.07)
Basophils Absolute: 0.1 K/uL (ref 0.0–0.1)
Basophils Relative: 1 %
Eosinophils Absolute: 0.4 K/uL (ref 0.0–0.5)
Eosinophils Relative: 4 %
HCT: 42.1 % (ref 39.0–52.0)
Hemoglobin: 14 g/dL (ref 13.0–17.0)
Immature Granulocytes: 0 %
Lymphocytes Relative: 40 %
Lymphs Abs: 3.7 K/uL (ref 0.7–4.0)
MCH: 33.3 pg (ref 26.0–34.0)
MCHC: 33.3 g/dL (ref 30.0–36.0)
MCV: 100.2 fL — ABNORMAL HIGH (ref 80.0–100.0)
Monocytes Absolute: 1.1 K/uL — ABNORMAL HIGH (ref 0.1–1.0)
Monocytes Relative: 12 %
Neutro Abs: 4.1 K/uL (ref 1.7–7.7)
Neutrophils Relative %: 43 %
Platelets: 158 K/uL (ref 150–400)
RBC: 4.2 MIL/uL — ABNORMAL LOW (ref 4.22–5.81)
RDW: 15.3 % (ref 11.5–15.5)
WBC: 9.3 K/uL (ref 4.0–10.5)
nRBC: 0 % (ref 0.0–0.2)

## 2024-01-20 LAB — LIPASE, BLOOD: Lipase: 50 U/L (ref 11–51)

## 2024-01-20 MED ORDER — HYDROMORPHONE HCL 1 MG/ML IJ SOLN
1.0000 mg | Freq: Once | INTRAMUSCULAR | Status: AC
  Administered 2024-01-20: 1 mg via INTRAVENOUS
  Filled 2024-01-20: qty 1

## 2024-01-20 MED ORDER — ONDANSETRON HCL 4 MG/2ML IJ SOLN: 4.0000 mg | Freq: Four times a day (QID) | INTRAMUSCULAR | Status: DC | PRN

## 2024-01-20 MED ORDER — SODIUM CHLORIDE 0.9 % IV SOLN: INTRAVENOUS | Status: DC

## 2024-01-20 MED ORDER — ENOXAPARIN SODIUM 40 MG/0.4ML IJ SOSY
40.0000 mg | PREFILLED_SYRINGE | INTRAMUSCULAR | Status: DC
  Filled 2024-01-20 (×2): qty 0.4

## 2024-01-20 MED ORDER — FOLIC ACID 1 MG PO TABS
1.0000 mg | ORAL_TABLET | Freq: Every day | ORAL | Status: DC
Start: 2024-01-20 — End: 2024-01-22
  Administered 2024-01-20 – 2024-01-22 (×3): 1 mg via ORAL
  Filled 2024-01-20 (×3): qty 1

## 2024-01-20 MED ORDER — IOHEXOL 350 MG/ML SOLN
75.0000 mL | Freq: Once | INTRAVENOUS | Status: AC | PRN
  Administered 2024-01-20: 75 mL via INTRAVENOUS

## 2024-01-20 MED ORDER — OXYCODONE HCL 5 MG PO TABS
5.0000 mg | ORAL_TABLET | ORAL | Status: DC | PRN
  Administered 2024-01-20 – 2024-01-21 (×4): 5 mg via ORAL
  Filled 2024-01-20 (×4): qty 1

## 2024-01-20 MED ORDER — THIAMINE MONONITRATE 100 MG PO TABS
100.0000 mg | ORAL_TABLET | Freq: Every day | ORAL | Status: DC
  Administered 2024-01-20 – 2024-01-22 (×3): 100 mg via ORAL
  Filled 2024-01-20 (×5): qty 1

## 2024-01-20 MED ORDER — SODIUM CHLORIDE 0.9 % IV BOLUS
1000.0000 mL | Freq: Once | INTRAVENOUS | Status: AC
  Administered 2024-01-20: 1000 mL via INTRAVENOUS

## 2024-01-20 MED ORDER — MORPHINE SULFATE (PF) 4 MG/ML IV SOLN
4.0000 mg | Freq: Once | INTRAVENOUS | Status: AC
  Administered 2024-01-20: 4 mg via INTRAVENOUS
  Filled 2024-01-20: qty 1

## 2024-01-20 MED ORDER — ONDANSETRON HCL 4 MG/2ML IJ SOLN
4.0000 mg | Freq: Once | INTRAMUSCULAR | Status: AC
  Administered 2024-01-20: 4 mg via INTRAVENOUS
  Filled 2024-01-20: qty 2

## 2024-01-20 NOTE — H&P (Signed)
 History and Physical    Patient: Evan West FMW:[id number] DOB: [birth year] DOA: 01/20/2024 DOS: the patient was seen and examined on 01/20/2024 PCP: Pcp, No  Patient coming from: Home  Chief Complaint:  Chief Complaint  Patient presents with   Abdominal Pain       HPI:  48 y.o. M with hx alcohol induced pancreatitis, abnormal lung imaging and abnormal prostate imaging who presented with epigastric pain.  Admitted 3 months ago with pancreatitis in setting of heavy EtOH use, found to have a small area of necrosis, resolved in the hospital.  Since then, has been having brief sharp epigastric pains, most days (lasting seconds, no provoking factors).    Then 1 week ago, after a heavy meal, developed more severe/persistent epigastric pain and vomiting. This persisted all week with any food, so he returned to the ER.  In the ER, CT A/P showed acute pancreatitis and unchagned necrotic area in uncinate process and maybe a new smaller collection on the medial duodenal wall.  Given fluids and still in pain so hospitalist service asked to admit for pancreatitis.      Review of Systems  Constitutional:  Negative for chills, fever, malaise/fatigue and weight loss.  Gastrointestinal:  Positive for abdominal pain, nausea and vomiting. Negative for blood in stool, constipation, diarrhea and melena.  All other systems reviewed and are negative.    Past Medical History:  Diagnosis Date   Necrotizing pancreatitis    Past Surgical History:  Procedure Laterality Date   NO PAST SURGERIES     Social History:  reports that he has been smoking cigarettes. He has a 3.6 pack-year smoking history. He does not have any smokeless tobacco history on file. He reports current alcohol use. He reports that he does not use drugs.  No Known Allergies  Family History  Problem Relation Age of Onset   Diabetes Other     Prior to Admission medications   Medication Sig Start Date End Date Taking?  Authorizing Provider  folic acid  (FOLVITE ) 1 MG tablet Take 1 tablet (1 mg total) by mouth daily. 10/16/23 10/15/24  Elgergawy, Brayton RAMAN, MD  thiamine  (VITAMIN B1) 100 MG tablet Take 1 tablet (100 mg total) by mouth daily. 10/16/23   Elgergawy, Brayton RAMAN, MD    Physical Exam: Vitals:   01/20/24 1131 01/20/24 1140 01/20/24 1450 01/20/24 1721  BP: (!) 144/88  (!) 146/71 137/74  Pulse: 93  (!) 59 (!) 56  Resp: 17  19   Temp: 98.4 F (36.9 C)  98.5 F (36.9 C) 98.2 F (36.8 C)  TempSrc:   Oral Oral  SpO2: 100%  100% 98%  Weight:  85.3 kg    Height:  5' 8 (1.727 m)     Adult male, sitting on the edge of the bed, no acute distress, conversational RRR, no murmurs, no peripheral edema Lungs clear without rales or wheezes Abdomen soft, epigastric tenderness to palpation, no rigidity or rebound, mild guarding in the epigastrium Attention normal, affect appropriate, judgment and insight appear normal, interactive, oriented x 3, face metric, moves all extremities with normal strength and coordination     Data Reviewed: Comprehensive metabolic panel shows mild hyponatremia, normal otherwise electrolytes and renal function LFTs normal CBC shows mild macrocytosis, no other abnormalities CT abdomen and pelvis, personally reviewed, shows small necrotic cyst, stranding around the pancreas    Assessment and Plan: * Necrotizing pancreatitis LFTs normal.  Reports he was drinking 7 forty oz beers daily before, but  has cut back substantially, only a few sips.  CT shows acute pancreatitis, lipase normal, Ranson's risk very low.  Mild overall. - Bland diet - IV fluids - Oxycodone  - GI follow up after discharge - EtOH and smoking cessation complete    Alcohol use disorder Reports substantial cut back.  No prior withdrawal, not withdrawing now, no alcohol at all in 1 week.  Hyponatremia Mild, likely due to poor PO intake - IVF and trend         Advance Care Planning: Full  code  Consults: None  Family Communication: None  Severity of Illness: The appropriate patient status for this patient is OBSERVATION. Observation status is judged to be reasonable and necessary in order to provide the required intensity of service to ensure the patient's safety. The patient's presenting symptoms, physical exam findings, and initial radiographic and laboratory data in the context of their medical condition is felt to place them at decreased risk for further clinical deterioration. Furthermore, it is anticipated that the patient will be medically stable for discharge from the hospital within 2 midnights of admission.   Author: Lonni SHAUNNA Dalton, MD 01/20/2024 5:30 PM  For on call review [email or web address].

## 2024-01-20 NOTE — ED Triage Notes (Signed)
 PT complains pancreatitis flare up.  Abd pain, Weakness and vomiting x 1 week.

## 2024-01-20 NOTE — Assessment & Plan Note (Signed)
 Mild, likely due to poor PO intake - IVF and trend

## 2024-01-20 NOTE — ED Triage Notes (Signed)
 Pt here for centralized abd pain that started last week.  C/O dizziness/n/v. Denies CP.

## 2024-01-20 NOTE — ED Provider Notes (Signed)
 [place] EMERGENCY DEPARTMENT AT [facility] Provider Note   CSN: [id number] Arrival date & time: 01/20/24  1129     Patient presents with: Abdominal Pain   Evan West is a 48 y.o. male.   The history is provided by the patient and medical records. No language interpreter was used.  Abdominal Pain    48 year old male with history pancreatitis, alcohol use, prior abdominal surgery presenting with complaint of abdominal pain.  Patient states for the past 3 days he has had persistent upper abdominal pain.  Described pain as a crampy achy sensation worse with eating.  States he feels very nauseous and cannot keep anything down.  Last bowel movement was yesterday.  He denies having fever but does endorse some chills.  Denies any chest pain shortness of breath productive cough no blood in the vomit and no urinary symptoms.  He mentioned his symptoms felt similar to parotitis that he has had in the past.  He denies any recent alcohol use and denies marijuana use.  Denies any prior abdominal surgeries.  Prior to Admission medications   Medication Sig Start Date End Date Taking? Authorizing Provider  folic acid  (FOLVITE ) 1 MG tablet Take 1 tablet (1 mg total) by mouth daily. 10/16/23 10/15/24  Elgergawy, Brayton RAMAN, MD  thiamine  (VITAMIN B1) 100 MG tablet Take 1 tablet (100 mg total) by mouth daily. 10/16/23   Elgergawy, Brayton RAMAN, MD    Allergies: Patient has no known allergies.    Review of Systems  Gastrointestinal:  Positive for abdominal pain.  All other systems reviewed and are negative.   Updated Vital Signs BP (!) 144/88   Pulse 93   Temp 98.4 F (36.9 C)   Resp 17   Ht 5' 8 (1.727 m)   Wt 85.3 kg   SpO2 100%   BMI 28.59 kg/m   Physical Exam Constitutional:      General: He is not in acute distress.    Appearance: He is well-developed.  HENT:     Head: Atraumatic.  Eyes:     Conjunctiva/sclera: Conjunctivae normal.  Cardiovascular:     Rate and  Rhythm: Normal rate and regular rhythm.  Pulmonary:     Effort: Pulmonary effort is normal.     Breath sounds: Normal breath sounds.  Abdominal:     Palpations: Abdomen is soft.     Tenderness: There is abdominal tenderness in the epigastric area and left upper quadrant. There is no guarding. Negative signs include Murphy's sign and McBurney's sign.     Hernia: No hernia is present.  Musculoskeletal:     Cervical back: Normal range of motion and neck supple.  Skin:    Findings: No rash.  Neurological:     Mental Status: He is alert.     (all labs ordered are listed, but only abnormal results are displayed) Labs Reviewed  CBC WITH DIFFERENTIAL/PLATELET - Abnormal; Notable for the following components:      Result Value   RBC 4.20 (*)    MCV 100.2 (*)    Monocytes Absolute 1.1 (*)    All other components within normal limits  COMPREHENSIVE METABOLIC PANEL WITH GFR - Abnormal; Notable for the following components:   Sodium 132 (*)    Glucose, Bld 110 (*)    All other components within normal limits  URINALYSIS, ROUTINE W REFLEX MICROSCOPIC - Abnormal; Notable for the following components:   Color, Urine AMBER (*)    Hgb urine dipstick SMALL (*)  Ketones, ur 5 (*)    Bacteria, UA RARE (*)    All other components within normal limits  LIPASE, BLOOD    EKG: None  Radiology: CT ABDOMEN PELVIS W CONTRAST Result Date: 01/20/2024 CLINICAL DATA:  Left upper quadrant abdominal pain. EXAM: CT ABDOMEN AND PELVIS WITH CONTRAST TECHNIQUE: Multidetector CT imaging of the abdomen and pelvis was performed using the standard protocol following bolus administration of intravenous contrast. RADIATION DOSE REDUCTION: This exam was performed according to the departmental dose-optimization program which includes automated exposure control, adjustment of the mA and/or kV according to patient size and/or use of iterative reconstruction technique. CONTRAST:  75mL OMNIPAQUE  IOHEXOL  350 MG/ML SOLN  COMPARISON:  CT abdomen pelvis dated 10/31/2023. FINDINGS: Lower chest: The visualized lung bases are clear. No intra-abdominal free air or free fluid. Hepatobiliary: Fatty liver. No biliary ductal dilatation. The gallbladder is unremarkable Pancreas: Inflammatory changes predominantly involving the head and uncinate process of the pancreas consistent with acute pancreatitis. A 1.7 x 0.9 cm hypodense collection in the uncinate process of the pancreas as well as additional smaller collection along the medial duodenal wall most consistent with pseudocysts and decreased in size since the prior CT. Underlying malignancy is less likely but not excluded. Clinical correlation and follow-up recommended. MRI may provide better evaluation. Spleen: Normal in size without focal abnormality. Adrenals/Urinary Tract: The adrenal glands unremarkable. The kidneys, visualized ureters, and urinary bladder appear unremarkable. Stomach/Bowel: There is no bowel obstruction. Inflammatory changes of the second portion of the duodenum likely reactive to acute pancreatitis. The appendix is normal. Vascular/Lymphatic: Mild aortoiliac atherosclerotic disease. The IVC is unremarkable. No portal venous gas. There is no adenopathy. Reproductive: The prostate and seminal vesicles are grossly remarkable. Other: None Musculoskeletal: Degenerative changes of the spine. No acute osseous pathology. IMPRESSION: 1. Acute pancreatitis. Interval decrease in the size of the pseudocysts compared to prior CT. Fatty liver. 2. No bowel obstruction. Normal appendix. 3.  Aortic Atherosclerosis (ICD10-I70.0). Electronically Signed   By: Vanetta Chou M.D.   On: 01/20/2024 13:52     Procedures   Medications Ordered in the ED  morphine  (PF) 4 MG/ML injection 4 mg (4 mg Intravenous Given 01/20/24 1213)  ondansetron  (ZOFRAN ) injection 4 mg (4 mg Intravenous Given 01/20/24 1212)  sodium chloride  0.9 % bolus 1,000 mL (0 mLs Intravenous Stopped 01/20/24 1438)   iohexol  (OMNIPAQUE ) 350 MG/ML injection 75 mL (75 mLs Intravenous Contrast Given 01/20/24 1326)                                    Medical Decision Making Amount and/or Complexity of Data Reviewed Labs: ordered. Radiology: ordered.  Risk Prescription drug management.   BP (!) 144/88   Pulse 93   Temp 98.4 F (36.9 C)   Resp 17   Ht 5' 8 (1.727 m)   Wt 85.3 kg   SpO2 100%   BMI 28.59 kg/m   63:4 AM  48 year old male with history pancreatitis, alcohol use, prior abdominal surgery presenting with complaint of abdominal pain.  Patient states for the past 3 days he has had persistent upper abdominal pain.  Described pain as a crampy achy sensation worse with eating.  States he feels very nauseous and cannot keep anything down.  Last bowel movement was yesterday.  He denies having fever but does endorse some chills.  Denies any chest pain shortness of breath productive cough no blood in the  vomit and no urinary symptoms.  He mentioned his symptoms felt similar to parotitis that he has had in the past.  He denies any recent alcohol use and denies marijuana use.  Denies any prior abdominal surgeries.  Exam notable for tenderness to epigastric and left upper quadrant on palpation no guarding no rebound tenderness.  Bowel sounds present.  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  Will get initiated will provide supportive care.  -Labs ordered, independently viewed and interpreted by me.  Labs remarkable for sodium 132, IV fluid given -The patient was maintained on a cardiac monitor.  I personally viewed and interpreted the cardiac monitored which showed an underlying rhythm of: Sinus rhythm -Imaging independently viewed and interpreted by me and I agree with radiologist's interpretation.  Result remarkable for abdominal pelvis CT scan demonstrate evidence of acute pancreatitis without complication -This patient presents to the ED for concern of  abdominal pain, this involves an extensive number of treatment options, and is a complaint that carries with it a high risk of complications and morbidity.  The differential diagnosis includes pancreatitis, appendicitis, cholecystitis, colitis, diverticulitis -Co morbidities that complicate the patient evaluation includes pancreatitis -Treatment includes Dilaudid , morphine , IV fluid, Zofran  -Reevaluation of the patient after these medicines showed that the patient improved -PCP office notes or outside notes reviewed -Discussion with oncoming team who will consult medicine for admission -Escalation to admission/observation considered: patient agrees with admission     Final diagnoses:  Alcohol-induced acute pancreatitis, unspecified complication status    ED Discharge Orders     None          Nivia Colon, PA-C 01/20/24 1553    Yolande Lamar BROCKS, MD 01/21/24 1001

## 2024-01-20 NOTE — Hospital Course (Addendum)
 48 y.o. M with hx alcohol induced pancreatitis, abnormal lung imaging and abnormal prostate imaging who presented with epigastric pain.  Admitted 3 months ago with pancreatitis in setting of heavy EtOH use, found to have a small area of necrosis, resolved in the hospital.  Since then, has been having brief sharp epigastric pains, most days (lasting seconds, no provoking factors).    Then 1 week ago, after a heavy meal, developed more severe/persistent epigastric pain and vomiting. This persisted all week with any food, so he returned to the ER.  In the ER, CT A/P showed acute pancreatitis and unchagned necrotic area in uncinate process and maybe a new smaller collection on the medial duodenal wall.  Given fluids and still in pain so hospitalist service asked to admit for pancreatitis.

## 2024-01-20 NOTE — Assessment & Plan Note (Signed)
 Reports substantial cut back.  No prior withdrawal, not withdrawing now, no alcohol at all in 1 week.

## 2024-01-20 NOTE — Assessment & Plan Note (Signed)
 LFTs normal.  Reports he was drinking 7 forty oz beers daily before, but has cut back substantially, only a few sips.  CT shows acute pancreatitis, lipase normal, Ranson's risk very low.  Mild overall. - Bland diet - IV fluids - Oxycodone  - GI follow up after discharge - EtOH and smoking cessation complete

## 2024-01-21 DIAGNOSIS — K863 Pseudocyst of pancreas: Secondary | ICD-10-CM | POA: Diagnosis present

## 2024-01-21 DIAGNOSIS — K859 Acute pancreatitis without necrosis or infection, unspecified: Secondary | ICD-10-CM | POA: Diagnosis present

## 2024-01-21 DIAGNOSIS — F1010 Alcohol abuse, uncomplicated: Secondary | ICD-10-CM | POA: Diagnosis present

## 2024-01-21 DIAGNOSIS — K8521 Alcohol induced acute pancreatitis with uninfected necrosis: Secondary | ICD-10-CM | POA: Diagnosis present

## 2024-01-21 DIAGNOSIS — E871 Hypo-osmolality and hyponatremia: Secondary | ICD-10-CM | POA: Diagnosis present

## 2024-01-21 DIAGNOSIS — Z833 Family history of diabetes mellitus: Secondary | ICD-10-CM | POA: Diagnosis not present

## 2024-01-21 DIAGNOSIS — F17210 Nicotine dependence, cigarettes, uncomplicated: Secondary | ICD-10-CM | POA: Diagnosis present

## 2024-01-21 DIAGNOSIS — K8591 Acute pancreatitis with uninfected necrosis, unspecified: Secondary | ICD-10-CM | POA: Diagnosis not present

## 2024-01-21 LAB — COMPREHENSIVE METABOLIC PANEL WITH GFR
ALT: 20 U/L (ref 0–44)
AST: 18 U/L (ref 15–41)
Albumin: 2.9 g/dL — ABNORMAL LOW (ref 3.5–5.0)
Alkaline Phosphatase: 71 U/L (ref 38–126)
Anion gap: 11 (ref 5–15)
BUN: 5 mg/dL — ABNORMAL LOW (ref 6–20)
CO2: 25 mmol/L (ref 22–32)
Calcium: 8.6 mg/dL — ABNORMAL LOW (ref 8.9–10.3)
Chloride: 104 mmol/L (ref 98–111)
Creatinine, Ser: 0.9 mg/dL (ref 0.61–1.24)
GFR, Estimated: >60 mL/min (ref >60)
Glucose, Bld: 122 mg/dL — ABNORMAL HIGH (ref 70–99)
Potassium: 4 mmol/L (ref 3.5–5.1)
Sodium: 140 mmol/L (ref 135–145)
Total Bilirubin: 0.8 mg/dL (ref 0.0–1.2)
Total Protein: 6.4 g/dL — ABNORMAL LOW (ref 6.5–8.1)

## 2024-01-21 LAB — HIV ANTIBODY (ROUTINE TESTING W REFLEX): HIV Screen 4th Generation wRfx: NONREACTIVE

## 2024-01-21 LAB — CBC
HCT: 38 % — ABNORMAL LOW (ref 39.0–52.0)
Hemoglobin: 12.7 g/dL — ABNORMAL LOW (ref 13.0–17.0)
MCH: 33.6 pg (ref 26.0–34.0)
MCHC: 33.4 g/dL (ref 30.0–36.0)
MCV: 100.5 fL — ABNORMAL HIGH (ref 80.0–100.0)
Platelets: 145 K/uL — ABNORMAL LOW (ref 150–400)
RBC: 3.78 MIL/uL — ABNORMAL LOW (ref 4.22–5.81)
RDW: 14.7 % (ref 11.5–15.5)
WBC: 8.1 K/uL (ref 4.0–10.5)
nRBC: 0 % (ref 0.0–0.2)

## 2024-01-21 MED ORDER — OXYCODONE HCL 5 MG PO TABS
10.0000 mg | ORAL_TABLET | ORAL | Status: DC | PRN
  Administered 2024-01-21 – 2024-01-22 (×4): 10 mg via ORAL
  Filled 2024-01-21 (×4): qty 2

## 2024-01-21 MED ORDER — SIMETHICONE 80 MG PO CHEW
80.0000 mg | CHEWABLE_TABLET | Freq: Once | ORAL | Status: AC
  Administered 2024-01-21: 80 mg via ORAL
  Filled 2024-01-21: qty 1

## 2024-01-21 NOTE — Progress Notes (Signed)
  Progress Note   Patient: Evan West FMW:[id number] DOB: May 13, 1976 DOA: 01/20/2024     0 DOS: the patient was seen and examined on 01/21/2024        Brief hospital course: 48 y.o. M with hx alcohol induced pancreatitis, abnormal lung imaging and abnormal prostate imaging who presented with acute pancreatitis.     Assessment and Plan: * Necrotizing pancreatitis Still having pain with any PO intake - Continue IV fluids - Continue bowel rest and analgesics     Alcohol use disorder No evidence of withdrawal  Hyponatremia Resolved with fluids          Subjective: Still with severe pain, poor PO intake.     Physical Exam: BP (!) 140/70 (BP Location: Left Arm)   Pulse (!) 58   Temp 98.2 F (36.8 C) (Oral)   Resp 18   Ht 5' 8 (1.727 m)   Wt 85.3 kg   SpO2 100%   BMI 28.59 kg/m   Adult male, lying in bed, appears tired RRR no murmurs, no LE edema Respiratory rate normal, lungs clear Abdomen soft, but guarding in epigastrium, tenderness Attention normal, face symmetric, speech fluent.    Data Reviewed: BMP now normal CBC mild anemia, mild thrombocytopenia    Family Communication:     Disposition: Status is: Inpatient         Author: Lonni SHAUNNA Dalton, MD 01/21/2024 1:39 PM  For on call review [email or web address].

## 2024-01-21 NOTE — Plan of Care (Signed)

## 2024-01-21 NOTE — TOC CM/SW Note (Signed)
 Transition of Care [facility]) - Inpatient Brief Assessment   Patient Details  Name: Onyekachi Gathright MRN: [id number] Date of Birth: April 13, 1976  [facility] CM/SW Contact:    Lauraine FORBES Saa, LCSWA Phone Number: 01/21/2024, 8:56 AM   Clinical Narrative:  8:56 AM Per chart review, patient resides at home with relatives. Patient has insurance but does not have a PCP. Patient does not have SNF/HH/DME history. Patient's preferred pharmacy's are [facility] [address] and [place] [address] [place]. TOC will continue to follow and be available to assist.  Transition of Care Asessment: Insurance and Status: Insurance coverage has been reviewed Patient has primary care physician: No Home environment has been reviewed: Private Residence Prior level of function:: N/A Prior/Current Home Services: No current home services Social Drivers of Health Review: SDOH reviewed no interventions necessary Readmission risk has been reviewed: Yes (Currently Observation Status) Transition of care needs: transition of care needs identified, TOC will continue to follow

## 2024-01-22 DIAGNOSIS — K8591 Acute pancreatitis with uninfected necrosis, unspecified: Secondary | ICD-10-CM | POA: Diagnosis not present

## 2024-01-22 MED ORDER — OXYCODONE HCL 5 MG PO TABS: 5.0000 mg | ORAL_TABLET | Freq: Three times a day (TID) | ORAL | 0 refills | Status: DC | PRN

## 2024-01-22 NOTE — Discharge Summary (Signed)
 Physician Discharge Summary   Patient: Evan West MRN: [id number] DOB: 1976/03/04  Admit date:     01/20/2024  Discharge date: 01/22/24  Discharge Physician: Lonni SHAUNNA Dalton   PCP: Ezzard Staci SAILOR, NP     Recommendations at discharge:  Follow up with new PCP Staci Ezzard for routine prophylactic care Follow up with Gastroenterology for pseudocyst work up     Discharge Diagnoses: Principal Problem:   Acute alcohol induced pancreatitis with pseudocyst Active Problems:   Hyponatremia   Alcohol use disorder     Hospital Course: 48 y.o. M with hx alcohol induced pancreatitis, abnormal lung imaging and abnormal prostate imaging who presented with epigastric pain.  Admitted 3 months ago with pancreatitis in setting of very heavy EtOH use, found to have a small area of necrosis, resolved in the hospital.  Discharged home.  Since then, pancreatitis resolved but had consistent brief sharp epigastric pains, most days (lasting seconds, no provoking factors).    Then 1 week ago, after a heavy greasy meal, developed more severe/persistent epigastric pain and vomiting. This persisted all week with any food, so he returned to the ER.  In the ER, CT A/P showed acute pancreatitis and unchagned necrotic area in uncinate process and maybe a new smaller collection on the medial duodenal wall.  Given fluids and still in pain so hospitalist service asked to admit for pancreatitis.       * Necrotizing pancreatitis LFTs normal.  Reports he was drinking 7 forty oz beers daily before the episode 3 months ago, but has cut back substantially since then, only a few sips.    CT here showed acute pancreatitis, no change to CBD, no stones.  Suspect this episode was induced by the pseudocyst, probably more alcohol than he reported.    Ranson's risk very low.  Treated with IV fluids and analgesics in the hospital.  Able to tolerate low fat diet last 24 hours with improved pain.     Discussed with GI, they will arrange follow up.   - Low fat diet - EtOH and smoking cessation complete    Alcohol use disorder Reports substantial cut back.  No prior withdrawal, no withdrawal noted in the hospital.   Hyponatremia Resolved with fluids            The [place]  Controlled Substances Registry was reviewed for this patient prior to discharge.  Consultants: None Procedures performed: CT abdomen  Disposition: Home Diet recommendation:  Low fat  DISCHARGE MEDICATION: Allergies as of 01/22/2024   No Known Allergies      Medication List     STOP taking these medications    folic acid  1 MG tablet Commonly known as: FOLVITE    thiamine  100 MG tablet Commonly known as: VITAMIN B1       TAKE these medications    oxyCODONE  5 MG immediate release tablet Commonly known as: Oxy IR/ROXICODONE  Take 1 tablet (5 mg total) by mouth every 8 (eight) hours as needed for moderate pain (pain score 4-6) or severe pain (pain score 7-10).        Follow-up Information     Ezzard Staci SAILOR, NP. Schedule an appointment as soon as possible for a visit in 1 week(s).   Specialty: Nurse Practitioner Contact information: [address] [place] [address] [phone]         Albertus Gordy HERO, MD. Schedule an appointment as soon as possible for a visit.   Specialty: Gastroenterology Why: with [facility] Contact  information: [address] [place] [address] [phone]                 Discharge Instructions     Discharge instructions   Complete by: As directed    **IMPORTANT DISCHARGE INSTRUCTIONS**   From Dr. Jonel: You were admitted with pancreatitis  You have what appears to be a small pseudocyst on your pancreas, which was the likely cause, in addition to alcohol and smoking cigarettes (both are known to be unfortunately closely associated with pancreatitis)  To resolve the pancreatitis, this will just take time and  disciplined diet Eat only a bland diet for 1 week  Bland includes anything NOT greasy or fatty or meaty: Toast Crackers Chicken broth Vegetable broth Jello Pudding Fresh fruit (bananas, berries) Rice Mashed potatoes without butter or gravy Fresh vegetables (cucumbers, carrots) Grits    After 1 week, advance to include things like chicken, eggs or lean proteins Keep that limited diet for at least 3 weeks after your symptoms have completely resolved AVOID anything fried, greasy NO RED MEAT NOTHING DEEP FRIED NO SAUSAGE   In May when you were here, they saw some abnormal findings on your CT chest, but these have resolved.    They also saw some abnormal findings on your prostate, but this has also resolved.  Neither of these things needs follow up   The cyst on your pancreas needs GI follow up though Go see a gastroenterology specialist at [facility] Dr. Albertus is who you saw in May Call his office and ask for an appointment with him or one of his nurse practitioners with the question: does any further testing need to be done for this pseudocyst?   Increase activity slowly   Complete by: As directed        Discharge Exam: Filed Weights   01/20/24 1140  Weight: 85.3 kg    General: Pt is alert, awake, not in acute distress Cardiovascular: RRR, nl S1-S2, no murmurs appreciated.   No LE edema.   Respiratory: Normal respiratory rate and rhythm.  CTAB without rales or wheezes. Abdominal: Abdomen soft and non-tender.  No distension or HSM.   Neuro/Psych: Strength symmetric in upper and lower extremities.  Judgment and insight appear normal.   Condition at discharge: good  The results of significant diagnostics from this hospitalization (including imaging, microbiology, ancillary and laboratory) are listed below for reference.   Imaging Studies: CT ABDOMEN PELVIS W CONTRAST Result Date: 01/20/2024 CLINICAL DATA:  Left upper quadrant abdominal pain. EXAM:  CT ABDOMEN AND PELVIS WITH CONTRAST TECHNIQUE: Multidetector CT imaging of the abdomen and pelvis was performed using the standard protocol following bolus administration of intravenous contrast. RADIATION DOSE REDUCTION: This exam was performed according to the departmental dose-optimization program which includes automated exposure control, adjustment of the mA and/or kV according to patient size and/or use of iterative reconstruction technique. CONTRAST:  75mL OMNIPAQUE  IOHEXOL  350 MG/ML SOLN COMPARISON:  CT abdomen pelvis dated 10/31/2023. FINDINGS: Lower chest: The visualized lung bases are clear. No intra-abdominal free air or free fluid. Hepatobiliary: Fatty liver. No biliary ductal dilatation. The gallbladder is unremarkable Pancreas: Inflammatory changes predominantly involving the head and uncinate process of the pancreas consistent with acute pancreatitis. A 1.7 x 0.9 cm hypodense collection in the uncinate process of the pancreas as well as additional smaller collection along the medial duodenal wall most consistent with pseudocysts and decreased in size since the prior CT. Underlying malignancy is less likely but not excluded.  Clinical correlation and follow-up recommended. MRI may provide better evaluation. Spleen: Normal in size without focal abnormality. Adrenals/Urinary Tract: The adrenal glands unremarkable. The kidneys, visualized ureters, and urinary bladder appear unremarkable. Stomach/Bowel: There is no bowel obstruction. Inflammatory changes of the second portion of the duodenum likely reactive to acute pancreatitis. The appendix is normal. Vascular/Lymphatic: Mild aortoiliac atherosclerotic disease. The IVC is unremarkable. No portal venous gas. There is no adenopathy. Reproductive: The prostate and seminal vesicles are grossly remarkable. Other: None Musculoskeletal: Degenerative changes of the spine. No acute osseous pathology. IMPRESSION: 1. Acute pancreatitis. Interval decrease in the  size of the pseudocysts compared to prior CT. Fatty liver. 2. No bowel obstruction. Normal appendix. 3.  Aortic Atherosclerosis (ICD10-I70.0). Electronically Signed   By: Vanetta Chou M.D.   On: 01/20/2024 13:52    Microbiology: Results for orders placed or performed during the hospital encounter of 07/15/14  Urine culture     Status: None   Collection Time: 07/15/14  6:22 PM   Specimen: Urine, Random  Result Value Ref Range Status   Specimen Description URINE, RANDOM  Final   Special Requests NONE  Final   Colony Count   Final    >=100,000 COLONIES/ML Performed at [facility]    Culture   Final    PROTEUS MIRABILIS Performed at [facility]    Report Status 07/18/2014 FINAL  Final   Organism ID, Bacteria PROTEUS MIRABILIS  Final      Susceptibility   Proteus mirabilis - MIC*    AMPICILLIN <=2 SENSITIVE Sensitive     CEFAZOLIN  <=4 SENSITIVE Sensitive     CEFTRIAXONE  <=1 SENSITIVE Sensitive     CIPROFLOXACIN <=0.25 SENSITIVE Sensitive     GENTAMICIN <=1 SENSITIVE Sensitive     LEVOFLOXACIN  <=0.12 SENSITIVE Sensitive     NITROFURANTOIN 128 RESISTANT Resistant     TOBRAMYCIN <=1 SENSITIVE Sensitive     TRIMETH/SULFA <=20 SENSITIVE Sensitive     PIP/TAZO <=4 SENSITIVE Sensitive     * PROTEUS MIRABILIS    Labs: CBC: Recent Labs  Lab 01/20/24 1200 01/21/24 0442  WBC 9.3 8.1  NEUTROABS 4.1  --   HGB 14.0 12.7*  HCT 42.1 38.0*  MCV 100.2* 100.5*  PLT 158 145*   Basic Metabolic Panel: Recent Labs  Lab 01/20/24 1200 01/21/24 0442  NA 132* 140  K 4.0 4.0  CL 98 104  CO2 25 25  GLUCOSE 110* 122*  BUN 6 5*  CREATININE 0.89 0.90  CALCIUM 9.0 8.6*   Liver Function Tests: Recent Labs  Lab 01/20/24 1200 01/21/24 0442  AST 29 18  ALT 25 20  ALKPHOS 86 71  BILITOT 1.2 0.8  PROT 7.8 6.4*  ALBUMIN 3.6 2.9*   CBG: No results for input(s): GLUCAP in the last 168 hours.  Discharge time spent: approximately 45 minutes spent on discharge  counseling, evaluation of patient on day of discharge, and coordination of discharge planning with nursing, social work, pharmacy and case management  Signed: Lonni SHAUNNA Dalton, MD [facility] 01/22/2024

## 2024-04-06 ENCOUNTER — Encounter: Payer: Self-pay | Admitting: Physician Assistant

## 2024-04-23 ENCOUNTER — Emergency Department (HOSPITAL_COMMUNITY)
Admission: EM | Admit: 2024-04-23 | Discharge: 2024-04-24 | Disposition: A | Attending: Emergency Medicine | Admitting: Emergency Medicine

## 2024-04-23 ENCOUNTER — Other Ambulatory Visit: Payer: Self-pay

## 2024-04-23 ENCOUNTER — Encounter (HOSPITAL_COMMUNITY): Payer: Self-pay | Admitting: *Deleted

## 2024-04-23 DIAGNOSIS — K852 Alcohol induced acute pancreatitis without necrosis or infection: Secondary | ICD-10-CM | POA: Diagnosis not present

## 2024-04-23 DIAGNOSIS — E876 Hypokalemia: Secondary | ICD-10-CM | POA: Diagnosis not present

## 2024-04-23 DIAGNOSIS — R1010 Upper abdominal pain, unspecified: Secondary | ICD-10-CM | POA: Diagnosis present

## 2024-04-23 HISTORY — DX: Acute pancreatitis without necrosis or infection, unspecified: K85.90

## 2024-04-23 LAB — COMPREHENSIVE METABOLIC PANEL WITH GFR
ALT: 20 U/L (ref 0–44)
AST: 18 U/L (ref 15–41)
Albumin: 3.4 g/dL — ABNORMAL LOW (ref 3.5–5.0)
Alkaline Phosphatase: 70 U/L (ref 38–126)
Anion gap: 13 (ref 5–15)
BUN: 8 mg/dL (ref 6–20)
CO2: 24 mmol/L (ref 22–32)
Calcium: 8.9 mg/dL (ref 8.9–10.3)
Chloride: 99 mmol/L (ref 98–111)
Creatinine, Ser: 1.01 mg/dL (ref 0.61–1.24)
GFR, Estimated: >60 mL/min (ref >60)
Glucose, Bld: 132 mg/dL — ABNORMAL HIGH (ref 70–99)
Potassium: 3.2 mmol/L — ABNORMAL LOW (ref 3.5–5.1)
Sodium: 136 mmol/L (ref 135–145)
Total Bilirubin: 0.4 mg/dL (ref 0.0–1.2)
Total Protein: 7.3 g/dL (ref 6.5–8.1)

## 2024-04-23 LAB — URINALYSIS, ROUTINE W REFLEX MICROSCOPIC
Bilirubin Urine: NEGATIVE
Glucose, UA: NEGATIVE mg/dL
Ketones, ur: NEGATIVE mg/dL
Nitrite: NEGATIVE
Protein, ur: 30 mg/dL — AB
Specific Gravity, Urine: 1.041 — ABNORMAL HIGH (ref 1.005–1.030)
pH: 5 (ref 5.0–8.0)

## 2024-04-23 LAB — CBC
HCT: 42.4 % (ref 39.0–52.0)
Hemoglobin: 14.2 g/dL (ref 13.0–17.0)
MCH: 31.9 pg (ref 26.0–34.0)
MCHC: 33.5 g/dL (ref 30.0–36.0)
MCV: 95.3 fL (ref 80.0–100.0)
Platelets: 201 K/uL (ref 150–400)
RBC: 4.45 MIL/uL (ref 4.22–5.81)
RDW: 13.3 % (ref 11.5–15.5)
WBC: 11.7 K/uL — ABNORMAL HIGH (ref 4.0–10.5)
nRBC: 0 % (ref 0.0–0.2)

## 2024-04-23 LAB — LIPASE, BLOOD: Lipase: 43 U/L (ref 11–51)

## 2024-04-23 MED ORDER — OXYCODONE-ACETAMINOPHEN 5-325 MG PO TABS
1.0000 | ORAL_TABLET | Freq: Once | ORAL | Status: AC
  Administered 2024-04-23: 1 via ORAL

## 2024-04-23 MED ORDER — OXYCODONE-ACETAMINOPHEN 5-325 MG PO TABS
ORAL_TABLET | ORAL | Status: AC
  Filled 2024-04-23: qty 1

## 2024-04-23 NOTE — ED Triage Notes (Signed)
 Pt c/o three days of upper abd pain. Pt states he has hx of pancreatitis and pain feels similar. Denies N/V/D.

## 2024-04-24 ENCOUNTER — Emergency Department (HOSPITAL_COMMUNITY)

## 2024-04-24 MED ORDER — HYDROMORPHONE HCL 1 MG/ML IJ SOLN
1.0000 mg | Freq: Once | INTRAMUSCULAR | Status: AC
  Administered 2024-04-24: 1 mg via INTRAVENOUS
  Filled 2024-04-24: qty 1

## 2024-04-24 MED ORDER — OXYCODONE HCL 5 MG PO TABS: 5.0000 mg | ORAL_TABLET | Freq: Four times a day (QID) | ORAL | 0 refills | Status: AC | PRN

## 2024-04-24 MED ORDER — ONDANSETRON HCL 4 MG/2ML IJ SOLN
4.0000 mg | Freq: Once | INTRAMUSCULAR | Status: AC
  Administered 2024-04-24: 4 mg via INTRAVENOUS
  Filled 2024-04-24: qty 2

## 2024-04-24 MED ORDER — IOHEXOL 350 MG/ML SOLN
75.0000 mL | Freq: Once | INTRAVENOUS | Status: AC | PRN
  Administered 2024-04-24: 75 mL via INTRAVENOUS

## 2024-04-24 MED ORDER — LACTATED RINGERS IV BOLUS
1000.0000 mL | Freq: Once | INTRAVENOUS | Status: AC
  Administered 2024-04-24: 1000 mL via INTRAVENOUS

## 2024-04-24 NOTE — ED Notes (Signed)
 Patient transported to CT

## 2024-04-24 NOTE — ED Provider Notes (Signed)
 [place] EMERGENCY DEPARTMENT AT [facility] Provider Note   CSN: [id number] Arrival date & time: 04/23/24  2113     Patient presents with: Abdominal Pain   Evan West is a 48 y.o. male.   Patient presents to the emergency department for evaluation of abdominal pain.  Patient reports progressively worsening upper abdominal pain for 3 days.  Patient reports a previous history of pancreatitis and this feels similar.       Prior to Admission medications   Medication Sig Start Date End Date Taking? Authorizing Provider  oxyCODONE  (ROXICODONE ) 5 MG immediate release tablet Take 1 tablet (5 mg total) by mouth every 6 (six) hours as needed for severe pain (pain score 7-10). 04/24/24  Yes Isatu Macinnes, Lonni PARAS, MD    Allergies: Patient has no known allergies.    Review of Systems  Updated Vital Signs BP 126/75   Pulse 72   Temp 98.6 F (37 C)   Resp 19   Ht 5' 8 (1.727 m)   Wt 85.3 kg   SpO2 99%   BMI 28.59 kg/m   Physical Exam Vitals and nursing note reviewed.  Constitutional:      General: He is not in acute distress.    Appearance: He is well-developed.  HENT:     Head: Normocephalic and atraumatic.     Mouth/Throat:     Mouth: Mucous membranes are moist.  Eyes:     General: Vision grossly intact. Gaze aligned appropriately.     Extraocular Movements: Extraocular movements intact.     Conjunctiva/sclera: Conjunctivae normal.  Cardiovascular:     Rate and Rhythm: Regular rhythm. Tachycardia present.     Pulses: Normal pulses.     Heart sounds: Normal heart sounds, S1 normal and S2 normal. No murmur heard.    No friction rub. No gallop.  Pulmonary:     Effort: Pulmonary effort is normal. No respiratory distress.     Breath sounds: Normal breath sounds.  Abdominal:     Palpations: Abdomen is soft.     Tenderness: There is abdominal tenderness in the epigastric area and left upper quadrant. There is no guarding or rebound.      Hernia: No hernia is present.  Musculoskeletal:        General: No swelling.     Cervical back: Full passive range of motion without pain, normal range of motion and neck supple. No pain with movement, spinous process tenderness or muscular tenderness. Normal range of motion.     Right lower leg: No edema.     Left lower leg: No edema.  Skin:    General: Skin is warm and dry.     Capillary Refill: Capillary refill takes less than 2 seconds.     Findings: No ecchymosis, erythema, lesion or wound.  Neurological:     Mental Status: He is alert and oriented to person, place, and time.     GCS: GCS eye subscore is 4. GCS verbal subscore is 5. GCS motor subscore is 6.     Cranial Nerves: Cranial nerves 2-12 are intact.     Sensory: Sensation is intact.     Motor: Motor function is intact. No weakness or abnormal muscle tone.     Coordination: Coordination is intact.  Psychiatric:        Mood and Affect: Mood normal.        Speech: Speech normal.        Behavior: Behavior normal.     (  all labs ordered are listed, but only abnormal results are displayed) Labs Reviewed  COMPREHENSIVE METABOLIC PANEL WITH GFR - Abnormal; Notable for the following components:      Result Value   Potassium 3.2 (*)    Glucose, Bld 132 (*)    Albumin 3.4 (*)    All other components within normal limits  CBC - Abnormal; Notable for the following components:   WBC 11.7 (*)    All other components within normal limits  URINALYSIS, ROUTINE W REFLEX MICROSCOPIC - Abnormal; Notable for the following components:   Color, Urine AMBER (*)    APPearance HAZY (*)    Specific Gravity, Urine 1.041 (*)    Hgb urine dipstick SMALL (*)    Protein, ur 30 (*)    Leukocytes,Ua TRACE (*)    Bacteria, UA RARE (*)    All other components within normal limits  LIPASE, BLOOD    EKG: None  Radiology: CT ABDOMEN PELVIS W CONTRAST Result Date: 04/24/2024 EXAM: CT ABDOMEN AND PELVIS WITH CONTRAST 04/24/2024 02:04:06 AM  TECHNIQUE: CT of the abdomen and pelvis was performed with the administration of 75 mL of iohexol  (OMNIPAQUE ) 350 MG/ML injection. Multiplanar reformatted images are provided for review. Automated exposure control, iterative reconstruction, and/or weight-based adjustment of the mA/kV was utilized to reduce the radiation dose to as low as reasonably achievable. COMPARISON: 01/20/2024 CLINICAL HISTORY: upper abdominal pain, history of complicated pancreatitis FINDINGS: LOWER CHEST: No acute abnormality. LIVER: Scattered subcentimeter hepatic cysts, benign. GALLBLADDER AND BILE DUCTS: Gallbladder is unremarkable. No biliary ductal dilatation. SPLEEN: No acute abnormality. PANCREAS: Peripancreatic changes involving the pancreatic head through the central process, pancreaticoduodenal groove, and adjacent proximal duodenum similar to prior, suggesting recurrent acute pancreatitis. Suspected ill-defined pseudocyst in the pancreas uncinate process measuring 16 mm (image 33), similar to the prior. Additional suspected low density collection along the proximal duodenum measuring 1.5 cm (image 29), new from the prior. ADRENAL GLANDS: No acute abnormality. KIDNEYS, URETERS AND BLADDER: No stones in the kidneys or ureters. No hydronephrosis. No perinephric or periureteral stranding. Urinary bladder is unremarkable. GI AND BOWEL: Stomach demonstrates no acute abnormality. There is no bowel obstruction. PERITONEUM AND RETROPERITONEUM: No ascites. No free air. VASCULATURE: Aorta is normal in caliber. LYMPH NODES: No lymphadenopathy. REPRODUCTIVE ORGANS: Prostate is unremarkable. BONES AND SOFT TISSUES: Mild degenerative changes at L5-S1. Small fat-containing umbilical and supraumbilical ventral wall hernias. IMPRESSION: 1. 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 Electronically signed by: Pinkie Pebbles MD 04/24/2024 02:20 AM EST RP Workstation: [id number]     Procedures   Medications Ordered in  the ED  oxyCODONE -acetaminophen  (PERCOCET/ROXICET) 5-325 MG per tablet 1 tablet (1 tablet Oral Given 04/23/24 2131)  lactated ringers  bolus 1,000 mL (0 mLs Intravenous Stopped 04/24/24 0226)  HYDROmorphone  (DILAUDID ) injection 1 mg (1 mg Intravenous Given 04/24/24 0058)  ondansetron  (ZOFRAN ) injection 4 mg (4 mg Intravenous Given 04/24/24 0058)  iohexol  (OMNIPAQUE ) 350 MG/ML injection 75 mL (75 mLs Intravenous Contrast Given 04/24/24 0204)                                    Medical Decision Making Amount and/or Complexity of Data Reviewed External Data Reviewed: labs, radiology and notes. Labs: ordered. Decision-making details documented in ED Course. Radiology: ordered and independent interpretation performed. Decision-making details documented in ED Course.  Risk Prescription drug management.   Differential Diagnosis considered includes, but not limited to:  Cholelithiasis; cholecystitis; cholangitis; bowel obstruction; esophagitis; gastritis; peptic ulcer disease; pancreatitis; cardiac.  Patient with history of alcoholic pancreatitis presents to the emergency department for evaluation of upper abdominal pain.  He reports that he is not currently drinking.  Pain feels similar to prior pancreatitis.  Record review reveals history of necrotizing pancreatitis with pseudocyst.  Patient's lab work unremarkable, normal lipase.  Because of his history, patient underwent CT scan to further evaluate pancreas.  He does have diffuse inflammatory changes consistent with acute pancreatitis.  No new areas of necrosis, does have evidence of pseudocyst.  Upon recheck he reports that he feels pretty good.  Vital signs are better, now normotensive, no more tachycardia.  Patient given option of bowel rest and analgesia at home versus hospitalization.  He feels well enough to try going home, given return precautions.     Final diagnoses:  Alcohol-induced acute pancreatitis without infection or necrosis     ED Discharge Orders          Ordered    oxyCODONE  (ROXICODONE ) 5 MG immediate release tablet  Every 6 hours PRN        04/24/24 0321               Haze Lonni PARAS, MD 04/24/24 0321

## 2024-05-06 ENCOUNTER — Emergency Department (HOSPITAL_COMMUNITY)

## 2024-05-06 ENCOUNTER — Encounter (HOSPITAL_COMMUNITY): Payer: Self-pay

## 2024-05-06 ENCOUNTER — Other Ambulatory Visit: Payer: Self-pay

## 2024-05-06 ENCOUNTER — Observation Stay (HOSPITAL_COMMUNITY)

## 2024-05-06 ENCOUNTER — Observation Stay (HOSPITAL_COMMUNITY)
Admission: EM | Admit: 2024-05-06 | Discharge: 2024-05-07 | Disposition: A | Discharge status: Home / Self Care | Attending: Emergency Medicine | Admitting: Emergency Medicine

## 2024-05-06 DIAGNOSIS — E876 Hypokalemia: Secondary | ICD-10-CM | POA: Insufficient documentation

## 2024-05-06 DIAGNOSIS — E871 Hypo-osmolality and hyponatremia: Secondary | ICD-10-CM | POA: Insufficient documentation

## 2024-05-06 DIAGNOSIS — Z79899 Other long term (current) drug therapy: Secondary | ICD-10-CM | POA: Insufficient documentation

## 2024-05-06 DIAGNOSIS — F17210 Nicotine dependence, cigarettes, uncomplicated: Secondary | ICD-10-CM

## 2024-05-06 DIAGNOSIS — F1021 Alcohol dependence, in remission: Secondary | ICD-10-CM | POA: Insufficient documentation

## 2024-05-06 DIAGNOSIS — K859 Acute pancreatitis without necrosis or infection, unspecified: Principal | ICD-10-CM | POA: Diagnosis present

## 2024-05-06 DIAGNOSIS — R109 Unspecified abdominal pain: Secondary | ICD-10-CM | POA: Diagnosis present

## 2024-05-06 DIAGNOSIS — F17220 Nicotine dependence, chewing tobacco, uncomplicated: Secondary | ICD-10-CM | POA: Insufficient documentation

## 2024-05-06 DIAGNOSIS — F1011 Alcohol abuse, in remission: Secondary | ICD-10-CM

## 2024-05-06 DIAGNOSIS — K861 Other chronic pancreatitis: Secondary | ICD-10-CM | POA: Insufficient documentation

## 2024-05-06 DIAGNOSIS — K863 Pseudocyst of pancreas: Secondary | ICD-10-CM | POA: Diagnosis not present

## 2024-05-06 LAB — CBC
HCT: 41.9 % (ref 39.0–52.0)
HCT: 40 % (ref 39.0–52.0)
Hemoglobin: 14.3 g/dL (ref 13.0–17.0)
Hemoglobin: 13.6 g/dL (ref 13.0–17.0)
MCH: 32.4 pg (ref 26.0–34.0)
MCH: 32.6 pg (ref 26.0–34.0)
MCHC: 34.1 g/dL (ref 30.0–36.0)
MCHC: 34 g/dL (ref 30.0–36.0)
MCV: 95 fL (ref 80.0–100.0)
MCV: 95.9 fL (ref 80.0–100.0)
Platelets: 205 K/uL (ref 150–400)
Platelets: 194 K/uL (ref 150–400)
RBC: 4.41 MIL/uL (ref 4.22–5.81)
RBC: 4.17 MIL/uL — ABNORMAL LOW (ref 4.22–5.81)
RDW: 14.4 % (ref 11.5–15.5)
RDW: 14.3 % (ref 11.5–15.5)
WBC: 11.1 K/uL — ABNORMAL HIGH (ref 4.0–10.5)
WBC: 13.2 K/uL — ABNORMAL HIGH (ref 4.0–10.5)
nRBC: 0 % (ref 0.0–0.2)
nRBC: 0 % (ref 0.0–0.2)

## 2024-05-06 LAB — COMPREHENSIVE METABOLIC PANEL WITH GFR
ALT: 26 U/L (ref 0–44)
AST: 25 U/L (ref 15–41)
Albumin: 3.7 g/dL (ref 3.5–5.0)
Alkaline Phosphatase: 99 U/L (ref 38–126)
Anion gap: 12 (ref 5–15)
BUN: 7 mg/dL (ref 6–20)
CO2: 25 mmol/L (ref 22–32)
Calcium: 9 mg/dL (ref 8.9–10.3)
Chloride: 95 mmol/L — ABNORMAL LOW (ref 98–111)
Creatinine, Ser: 0.9 mg/dL (ref 0.61–1.24)
GFR, Estimated: >60 mL/min (ref >60)
Glucose, Bld: 143 mg/dL — ABNORMAL HIGH (ref 70–99)
Potassium: 3.4 mmol/L — ABNORMAL LOW (ref 3.5–5.1)
Sodium: 132 mmol/L — ABNORMAL LOW (ref 135–145)
Total Bilirubin: 1.4 mg/dL — ABNORMAL HIGH (ref 0.0–1.2)
Total Protein: 8 g/dL (ref 6.5–8.1)

## 2024-05-06 LAB — URINALYSIS, ROUTINE W REFLEX MICROSCOPIC
Bacteria, UA: NONE SEEN
Glucose, UA: NEGATIVE mg/dL
Hgb urine dipstick: NEGATIVE
Ketones, ur: 5 mg/dL — AB
Leukocytes,Ua: NEGATIVE
Nitrite: NEGATIVE
Protein, ur: 100 mg/dL — AB
Specific Gravity, Urine: >1.046 — ABNORMAL HIGH (ref 1.005–1.030)
pH: 5 (ref 5.0–8.0)

## 2024-05-06 LAB — CREATININE, SERUM
Creatinine, Ser: 0.76 mg/dL (ref 0.61–1.24)
GFR, Estimated: >60 mL/min (ref >60)

## 2024-05-06 LAB — LIPASE, BLOOD: Lipase: 262 U/L — ABNORMAL HIGH (ref 11–51)

## 2024-05-06 MED ORDER — ENOXAPARIN SODIUM 40 MG/0.4ML IJ SOSY
40.0000 mg | PREFILLED_SYRINGE | INTRAMUSCULAR | Status: DC
  Administered 2024-05-06: 40 mg via SUBCUTANEOUS
  Filled 2024-05-06: qty 0.4

## 2024-05-06 MED ORDER — LACTATED RINGERS IV SOLN: INTRAVENOUS | Status: DC

## 2024-05-06 MED ORDER — ACETAMINOPHEN 650 MG RE SUPP: 650.0000 mg | Freq: Four times a day (QID) | RECTAL | Status: DC

## 2024-05-06 MED ORDER — ACETAMINOPHEN 500 MG PO TABS
1000.0000 mg | ORAL_TABLET | Freq: Four times a day (QID) | ORAL | Status: DC
  Administered 2024-05-06 – 2024-05-07 (×4): 1000 mg via ORAL
  Filled 2024-05-06 (×4): qty 2

## 2024-05-06 MED ORDER — ADULT MULTIVITAMIN W/MINERALS CH
1.0000 | ORAL_TABLET | Freq: Every day | ORAL | Status: DC
  Administered 2024-05-06 – 2024-05-07 (×2): 1 via ORAL
  Filled 2024-05-06 (×2): qty 1

## 2024-05-06 MED ORDER — POTASSIUM CHLORIDE CRYS ER 20 MEQ PO TBCR
40.0000 meq | EXTENDED_RELEASE_TABLET | Freq: Once | ORAL | Status: AC
  Administered 2024-05-06: 40 meq via ORAL
  Filled 2024-05-06: qty 2

## 2024-05-06 MED ORDER — FOLIC ACID 1 MG PO TABS
1.0000 mg | ORAL_TABLET | Freq: Every day | ORAL | Status: DC
  Administered 2024-05-06 – 2024-05-07 (×2): 1 mg via ORAL
  Filled 2024-05-06 (×2): qty 1

## 2024-05-06 MED ORDER — THIAMINE MONONITRATE 100 MG PO TABS
100.0000 mg | ORAL_TABLET | Freq: Every day | ORAL | Status: DC
  Administered 2024-05-06 – 2024-05-07 (×2): 100 mg via ORAL
  Filled 2024-05-06 (×2): qty 1

## 2024-05-06 MED ORDER — ONDANSETRON HCL 4 MG/2ML IJ SOLN
4.0000 mg | Freq: Three times a day (TID) | INTRAMUSCULAR | Status: DC | PRN
Start: 2024-05-06 — End: 2024-05-07

## 2024-05-06 MED ORDER — HYDROMORPHONE HCL 1 MG/ML IJ SOLN
1.0000 mg | Freq: Once | INTRAMUSCULAR | Status: AC
  Administered 2024-05-06: 1 mg via INTRAVENOUS
  Filled 2024-05-06: qty 1

## 2024-05-06 MED ORDER — SENNA 8.6 MG PO TABS
1.0000 | ORAL_TABLET | Freq: Two times a day (BID) | ORAL | Status: DC
  Administered 2024-05-06 – 2024-05-07 (×3): 8.6 mg via ORAL
  Filled 2024-05-06 (×3): qty 1

## 2024-05-06 MED ORDER — ONDANSETRON HCL 4 MG/2ML IJ SOLN
4.0000 mg | Freq: Once | INTRAMUSCULAR | Status: AC
  Administered 2024-05-06: 4 mg via INTRAVENOUS
  Filled 2024-05-06: qty 2

## 2024-05-06 MED ORDER — IOHEXOL 350 MG/ML SOLN
75.0000 mL | Freq: Once | INTRAVENOUS | Status: AC | PRN
  Administered 2024-05-06: 75 mL via INTRAVENOUS

## 2024-05-06 MED ORDER — SODIUM CHLORIDE 0.9 % IV SOLN: Freq: Once | INTRAVENOUS | Status: AC

## 2024-05-06 MED ORDER — SODIUM CHLORIDE 0.9 % IV BOLUS
1000.0000 mL | Freq: Once | INTRAVENOUS | Status: AC
  Administered 2024-05-06: 1000 mL via INTRAVENOUS

## 2024-05-06 MED ORDER — THIAMINE HCL 100 MG/ML IJ SOLN: 100.0000 mg | Freq: Every day | INTRAMUSCULAR | Status: DC

## 2024-05-06 MED ORDER — HYDROMORPHONE HCL 1 MG/ML IJ SOLN: 1.0000 mg | INTRAMUSCULAR | Status: DC | PRN

## 2024-05-06 MED ORDER — OXYCODONE HCL 5 MG PO TABS
10.0000 mg | ORAL_TABLET | ORAL | Status: DC | PRN
  Administered 2024-05-06 (×2): 10 mg via ORAL
  Filled 2024-05-06 (×2): qty 2

## 2024-05-06 NOTE — Hospital Course (Addendum)
 48 Hx alcohol induced pancratitis More similar pain over 1-2 weeks Oxycodone  provided in past, not sufficient Lipase going up CT - pseudocyst Sober 3-4 months K low, Na low   He came to the hospital because he knew his pancreatitis was acting up. Reports 8 times presenting to ED for this issue, often sent home on tylenol  or oxycodone  5 that was not sufficient. He is very confident his pain is consistent with prior episodes.  He is having occasional nausea and vomiting including a snack and juice from the ED. There is no blood in his emesis.  Right now he feels alright.  He denies alcohol use in the last 5 months. Prior to then, seemingly his first diagnosis of pancreatitis, he would consume beer daily.  His pain is chronic at this point since first diagnoses but worse over the last 2 weeks.  Current pain in throbbing in nature.  His pain sometimes moves from the epigastric area, might be in the LUQ or RUQ.  No bowel movements in 2-3 days but states he has had poor oral intake.   Smokes 1 pack every 10 days.  Denies alcohol withdrawal shakes.  Admitted 05/06/2024  Allergies: Patient has no known allergies. Pertinent Hx: Recurrent pancreatitis, AUD (in remission)  48 y.o. male p/w abdominal pain, N/V/D  *Pancreatitis: interval increase in pseudocyst; pain/nausea control on soft diet, advancing as tolerated *AUD: in remission, last drink 5-4mo ago; CIWA without Ativan   Consults: None VTE ppx: Lovenox  Diet: Soft

## 2024-05-06 NOTE — ED Triage Notes (Signed)
 Pt c.o abd pain and vomiting x 2 days, hx of pancreatitis

## 2024-05-06 NOTE — ED Provider Notes (Signed)
 [place] EMERGENCY DEPARTMENT AT [facility] Provider Note   CSN: [id number] Arrival date & time: 05/06/24  9092     Patient presents with: Abdominal Pain and Emesis   Evan West is a 48 y.o. male.   48 year old male with prior medical history as detailed below presents for evaluation.  Patient was history of pancreatitis likely related to heavy alcohol use.  Patient reports that he has been sober for the last 3 to 4 months.  He reports persistent epigastric abdominal pain consistent with prior episodes of pancreatitis.  He was seen on November 10 for same.  He has been taking oxycodone  at home without control of his pain.  He reports worsening pain and associated nausea and vomiting.  He denies fever.  The history is provided by the patient and medical records.       Prior to Admission medications   Medication Sig Start Date End Date Taking? Authorizing Provider  oxyCODONE  (ROXICODONE ) 5 MG immediate release tablet Take 1 tablet (5 mg total) by mouth every 6 (six) hours as needed for severe pain (pain score 7-10). 04/24/24   Haze Lonni PARAS, MD    Allergies: Patient has no known allergies.    Review of Systems  All other systems reviewed and are negative.   Updated Vital Signs BP (!) 154/94 (BP Location: Right Arm)   Pulse (!) 105   Temp 98 F (36.7 C)   Resp 18   Ht 5' 8 (1.727 m)   Wt 83.9 kg   SpO2 100%   BMI 28.13 kg/m   Physical Exam Vitals and nursing note reviewed.  Constitutional:      General: He is not in acute distress.    Appearance: Normal appearance. He is well-developed.  HENT:     Head: Normocephalic and atraumatic.  Eyes:     Conjunctiva/sclera: Conjunctivae normal.     Pupils: Pupils are equal, round, and reactive to light.  Cardiovascular:     Rate and Rhythm: Normal rate and regular rhythm.     Heart sounds: Normal heart sounds.  Pulmonary:     Effort: Pulmonary effort is normal. No respiratory distress.      Breath sounds: Normal breath sounds.  Abdominal:     General: There is no distension.     Palpations: Abdomen is soft.     Tenderness: There is abdominal tenderness in the epigastric area.  Musculoskeletal:        General: No deformity. Normal range of motion.     Cervical back: Normal range of motion and neck supple.  Skin:    General: Skin is warm and dry.  Neurological:     General: No focal deficit present.     Mental Status: He is alert and oriented to person, place, and time.     (all labs ordered are listed, but only abnormal results are displayed) Labs Reviewed  LIPASE, BLOOD - Abnormal; Notable for the following components:      Result Value   Lipase 262 (*)    All other components within normal limits  COMPREHENSIVE METABOLIC PANEL WITH GFR - Abnormal; Notable for the following components:   Sodium 132 (*)    Potassium 3.4 (*)    Chloride 95 (*)    Glucose, Bld 143 (*)    Total Bilirubin 1.4 (*)    All other components within normal limits  CBC - Abnormal; Notable for the following components:   WBC 11.1 (*)    All  other components within normal limits  URINALYSIS, ROUTINE W REFLEX MICROSCOPIC - Abnormal; Notable for the following components:   Color, Urine AMBER (*)    APPearance HAZY (*)    Specific Gravity, Urine >1.046 (*)    Bilirubin Urine SMALL (*)    Ketones, ur 5 (*)    Protein, ur 100 (*)    All other components within normal limits    EKG: None  Radiology: No results found.   Procedures   Medications Ordered in the ED - No data to display                                  Medical Decision Making Patient with known history of pancreatitis.  Patient reports increased epigastric pain and discomfort secondary to likely pancreatitis over the last week or 2.  He used oxycodone  at home without control of his symptoms.  Today he is presenting with complaint of increased epigastric pain.  Lipase is elevated at 262.  CT imaging suggests  acute pancreatitis with enlargement of pseudocyst.  Patient is improved with IV fluids and IV Dilaudid .  He would benefit from admission.  Teaching team is aware of case will evaluate for same.  Amount and/or Complexity of Data Reviewed Labs: ordered. Radiology: ordered.  Risk Prescription drug management. Decision regarding hospitalization.        Final diagnoses:  Acute pancreatitis, unspecified complication status, unspecified pancreatitis type    ED Discharge Orders     None          Laurice Maude BROCKS, MD 05/06/24 1316

## 2024-05-06 NOTE — H&P (Signed)
 Date: 05/06/2024               Patient Name:  Evan West MRN: [id number]  DOB: 09/02/1975 Age / Sex: 48 y.o., male   PCP: Ezzard Staci SAILOR, NP         Medical Service: Internal Medicine Teaching Service         Attending Physician: Dr. Jone Dauphin      First Contact: Letha Cheadle, MD    Second Contact: Dr. Lonni Africa, DO         Pager Information: First Contact Pager: [phone]   Second Contact Pager: [phone]   SUBJECTIVE   Chief Complaint: Abdominal pain  History of Present Illness  Evan West is a 48 y.o. male with PMHx of pancreatitis, AUD who presents for evaluation of abdominal pain over the past 2-3 days. The patient has had several admissions over the past 5-6 months for pancreatitis. He states that the pain he is experiencing right now is similar to prior episodes of pancreatitis.  Describes the pain as throbbing in the center of his abdomen which sometimes radiates across from left to right.  He is frustrated and notes that he has never been pain-free when at home.  He was most recently seen in the ED on 04/24/2024 and given oxycodone  5 mg.  Since then, he has been taking extra strength Tylenol  which has not improved his pain. He is having nausea and vomiting and has been unable to tolerate p.o. intake at home.  During his time in the ED, he has already been unable to tolerate a snack and juice in the ED.  He states that his last bowel movement was 2-3 days ago and thinks this is due to poor oral intake. Denies chest pain, shortness of breath, hematemesis, diarrhea, fevers/chills.  Meds  Current Meds  Medication Sig   oxyCODONE  (ROXICODONE ) 5 MG immediate release tablet Take 1 tablet (5 mg total) by mouth every 6 (six) hours as needed for severe pain (pain score 7-10).     Allergies  Allergies as of 05/06/2024   (No Known Allergies)     Past Medical History Alcohol-induced pancreatitis AUD  Past Surgical History Past Surgical  History:  Procedure Laterality Date   NO PAST SURGERIES       Social History  Living Situation:  Support: lives with partner Level of Function: Independent, able to complete all ADLs and IADLs PCP: Ezzard Staci SAILOR, NP  Substances: -Tobacco: smokes 1 pack every 10 days -Alcohol: previously heavy drinker; last drink 5-6 months ago -Recreational Drug: denies recreational or IVDU  Family History  Family History  Problem Relation Age of Onset   Diabetes Other      Review of Systems  A complete ROS was negative except as per HPI.   OBJECTIVE:   Physical Exam: Blood pressure (!) 155/93, pulse 84, temperature 98.4 F (36.9 C), temperature source Oral, resp. rate 14, height 5' 8 (1.727 m), weight 83.9 kg, SpO2 100%.  Constitutional: well-appearing, well-nourished, in no acute distress HENT: normocephalic atraumatic, mucous membranes moist Eyes: conjunctiva non-erythematous, PERRL, no scleral icterus Cardiovascular: regular rate and rhythm, no m/r/g Pulmonary/Chest: normal work of breathing on room air, lungs clear to auscultation bilaterally Abdominal: soft, non-distended, moderately TTP left of umbilicus; scars noted above umbilicus MSK: normal bulk and tone Neurological: alert & oriented x3, moving bilateral extremities equally Skin: warm and dry Extremities: no edema or cyanosis; peripheral pulses intact Psych: normal mood and affect, thought content normal  Labs: CBC    Component Value Date/Time   WBC 11.1 (H) 05/06/2024 0923   RBC 4.41 05/06/2024 0923   HGB 14.3 05/06/2024 0923   HCT 41.9 05/06/2024 0923   PLT 205 05/06/2024 0923   MCV 95.0 05/06/2024 0923   MCH 32.4 05/06/2024 0923   MCHC 34.1 05/06/2024 0923   RDW 14.4 05/06/2024 0923   LYMPHSABS 3.7 01/20/2024 1200   MONOABS 1.1 (H) 01/20/2024 1200   EOSABS 0.4 01/20/2024 1200   BASOSABS 0.1 01/20/2024 1200     CMP     Component Value Date/Time   NA 132 (L) 05/06/2024 0923   K 3.4 (L) 05/06/2024 0923    CL 95 (L) 05/06/2024 0923   CO2 25 05/06/2024 0923   GLUCOSE 143 (H) 05/06/2024 0923   BUN 7 05/06/2024 0923   CREATININE 0.90 05/06/2024 0923   CALCIUM 9.0 05/06/2024 0923   PROT 8.0 05/06/2024 0923   ALBUMIN 3.7 05/06/2024 0923   AST 25 05/06/2024 0923   ALT 26 05/06/2024 0923   ALKPHOS 99 05/06/2024 0923   BILITOT 1.4 (H) 05/06/2024 0923   GFRNONAA >60 05/06/2024 9076    Imaging: US  Abdomen Limited RUQ (LIVER/GB) Result Date: 05/06/2024 EXAM: Right Upper Quadrant Abdominal Ultrasound 05/06/2024 03:06:58 PM TECHNIQUE: Real-time ultrasonography of the right upper quadrant of the abdomen was performed. COMPARISON: None available. CLINICAL HISTORY: Acute pancreatitis. FINDINGS: LIVER: Diffusely increased liver parenchymal echotexture suggesting fatty infiltration. No intrahepatic biliary ductal dilatation. No evidence of mass. BILIARY SYSTEM: No pericholecystic fluid or wall thickening. No cholelithiasis. Common bile duct measures 4.7 mm. RIGHT KIDNEY: The right kidney is grossly unremarkable in appearances without evidence of hydronephrosis, echogenic calculi or worrisome mass lesions. PANCREAS: Incomplete visualization of the pancreas. Poorly defined cystic structure demonstrated adjacent to the uncinate process measuring 2.8 cm in diameter. This likely corresponds to the pseudocyst seen on prior CT. OTHER: No right upper quadrant ascites. IMPRESSION: 1. Poorly defined cystic structure adjacent to the uncinate process, likely corresponding to a pseudocyst. 2. No evidence of acute cholecystitis or cholelithiasis. Electronically signed by: Elsie Gravely MD 05/06/2024 03:11 PM EST RP Workstation: [id number]   CT ABDOMEN PELVIS W CONTRAST Result Date: 05/06/2024 CLINICAL DATA:  Abdominal pain.  Pancreatitis. EXAM: CT ABDOMEN AND PELVIS WITH CONTRAST TECHNIQUE: Multidetector CT imaging of the abdomen and pelvis was performed using the standard protocol following bolus administration of  intravenous contrast. RADIATION DOSE REDUCTION: This exam was performed according to the departmental dose-optimization program which includes automated exposure control, adjustment of the mA and/or kV according to patient size and/or use of iterative reconstruction technique. CONTRAST:  75mL OMNIPAQUE  IOHEXOL  350 MG/ML SOLN COMPARISON:  CT abdomen pelvis dated 04/24/2024. FINDINGS: Lower chest: The visualized lung bases are clear. No intra-abdominal free air or free fluid. Hepatobiliary: Fatty liver. No biliary dilatation. The gallbladder is unremarkable. Pancreas: There is inflammatory changes predominantly involving the head and uncinate process of the pancreas in keeping with known acute pancreatitis. Relatively similar size of the pseudocyst in the uncinate process of the pancreas. Interval increase in the size of a pseudocyst along the medial second portion of the duodenum measuring approximately 2.2 x 1.9 cm (previously 1.5 x 1.1 cm). Spleen: Normal in size without focal abnormality. Adrenals/Urinary Tract: The adrenal glands are unremarkable. There is no hydronephrosis on either side. There is symmetric enhancement and excretion of contrast by both kidneys. The visualized ureters and urinary bladder appear unremarkable Stomach/Bowel: Inflammatory changes of the second portion of the duodenum secondary to  acute pancreatitis. There is no bowel obstruction. The appendix is normal. Vascular/Lymphatic: Mild aortoiliac atherosclerotic disease. The IVC is unremarkable. The splenic vein, SMV, and main portal vein are patent. No portal venous gas. No adenopathy. Reproductive: The prostate and seminal vesicles are grossly unremarkable. Other: None Musculoskeletal: Degenerative changes of the lower lumbar spine. No acute osseous pathology. IMPRESSION: 1. Acute pancreatitis with interval increase in the size of a pseudocyst along the medial second portion of the duodenum. 2. Fatty liver. 3. No bowel obstruction. Normal  appendix. 4.  Aortic Atherosclerosis (ICD10-I70.0). Electronically Signed   By: Vanetta Chou M.D.   On: 05/06/2024 12:52    EKG: personally reviewed my interpretation is sinus tachycardia. Prior EKG from March 2024 showing sinus tachycardia with possible right atrial enlargement.  ASSESSMENT & PLAN:   Assessment & Plan by Problem: Principal Problem:   Acute pancreatitis   Evan West is a male living with a history of pancreatitis, AUD, who presented with abdominal pain and was admitted for acute pancreatitis on hospital day 0.  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  Has been seen in the hospital several times over the past 5 and 6 months for the same problem.  Has not had any pain-free stretches since first diagnosis.  Moderately tender to palpation on exam. Lipase elevated at 262.  RUQ US  negative for signs of biliary pathology.  Initial triglycerides 6 months ago was 77.  Not likely due to alcohol use as last drink was several months ago.  Not likely medication-induced as patient has not been home medications other than for pain.  Discussed case with GI who reviewed case and imaging. While increasing in size, pseudocyst is overall too small to intervene. They recommend pain control and outpatient follow up. He has scheduled appointment on 05/29/2024 with [facility].  Will advance diet as tolerated while controlling pain/nausea. - Pain regimen:  - Scheduled acetaminophen  1000 mg p.o. 4 times daily or 650 mg suppository 4 times daily  - Oxycodone  IR 10 mg every 4 hours as needed for severe pain - Dilaudid  1 mg every 4 hours as needed for breakthrough pain - Soft diet - IVF (LR) 125 cc/hr - Scheduled Senokot 8.6 mg twice daily for constipation  #Alcohol use disorder, in remission Patient states he was heavily drinking daily before first episode of pancreatitis.  Has  abstained from alcohol use since first admission.  Denies ever experiencing tremors after stopping but does report still having cravings for alcohol.  May benefit from starting naltrexone in the outpatient setting.  Will continue to monitor on CIWA protocol without Ativan . - CIWA without Ativan  - Thiamine , folate multivitamin supplementation  #Hyponatremia Mildly low Na of 133 upon admission. Likely hypovolemic in setting of poor PO intake from pancreatitis. Will continue to monitor while patient is receiving IVF. - Trend BMPs - LR 125 cc/hr  #Hypokalemia Initial K of 3.4, has been hypokalemic in the past. Likely in the setting of recent vomiting. Will replete with oral 40 mEq potassium chloride  and continue to monitor.  - P.o. potassium chloride  40 mEq - Trend BMPs  Best practice: Diet: Soft VTE: Enoxaparin  IVF: LR,125cc/hr Code: Full  Disposition planning: Prior to Admission Living Arrangement: Home Anticipated Discharge Location: Home  Dispo: Admit patient to Observation with expected length of stay less than 2 midnights.  Signed: Letha Cheadle, MD [facility]  PGY-1 05/06/2024, 4:23 PM  Please contact  IM Residency On-Call Pager at: [phone] or [phone].ha

## 2024-05-06 NOTE — ED Notes (Signed)
 Pt alert, NAD, calm, interactive, resps e/u, speaking in clear complete sentences, VSS, steady gait to b/r, skin W&D. Going to US  via w/c at this time.

## 2024-05-06 NOTE — Plan of Care (Signed)

## 2024-05-07 DIAGNOSIS — K859 Acute pancreatitis without necrosis or infection, unspecified: Secondary | ICD-10-CM | POA: Diagnosis not present

## 2024-05-07 DIAGNOSIS — E876 Hypokalemia: Secondary | ICD-10-CM | POA: Diagnosis not present

## 2024-05-07 DIAGNOSIS — E871 Hypo-osmolality and hyponatremia: Secondary | ICD-10-CM | POA: Diagnosis not present

## 2024-05-07 DIAGNOSIS — K863 Pseudocyst of pancreas: Secondary | ICD-10-CM | POA: Diagnosis not present

## 2024-05-07 LAB — CBC
HCT: 33.8 % — ABNORMAL LOW (ref 39.0–52.0)
Hemoglobin: 11.3 g/dL — ABNORMAL LOW (ref 13.0–17.0)
MCH: 32.8 pg (ref 26.0–34.0)
MCHC: 33.4 g/dL (ref 30.0–36.0)
MCV: 98.3 fL (ref 80.0–100.0)
Platelets: 150 K/uL (ref 150–400)
RBC: 3.44 MIL/uL — ABNORMAL LOW (ref 4.22–5.81)
RDW: 14.4 % (ref 11.5–15.5)
WBC: 10.4 K/uL (ref 4.0–10.5)
nRBC: 0 % (ref 0.0–0.2)

## 2024-05-07 LAB — BASIC METABOLIC PANEL WITH GFR
Anion gap: 10 (ref 5–15)
BUN: 7 mg/dL (ref 6–20)
CO2: 25 mmol/L (ref 22–32)
Calcium: 8.4 mg/dL — ABNORMAL LOW (ref 8.9–10.3)
Chloride: 100 mmol/L (ref 98–111)
Creatinine, Ser: 0.75 mg/dL (ref 0.61–1.24)
GFR, Estimated: >60 mL/min (ref >60)
Glucose, Bld: 102 mg/dL — ABNORMAL HIGH (ref 70–99)
Potassium: 3.4 mmol/L — ABNORMAL LOW (ref 3.5–5.1)
Sodium: 135 mmol/L (ref 135–145)

## 2024-05-07 LAB — MAGNESIUM: Magnesium: 1.7 mg/dL (ref 1.7–2.4)

## 2024-05-07 MED ORDER — POTASSIUM CHLORIDE 20 MEQ PO PACK
40.0000 meq | PACK | Freq: Two times a day (BID) | ORAL | Status: DC
  Administered 2024-05-07: 40 meq via ORAL
  Filled 2024-05-07: qty 2

## 2024-05-07 MED ORDER — TRAMADOL HCL 50 MG PO TABS: 50.0000 mg | ORAL_TABLET | Freq: Four times a day (QID) | ORAL | 0 refills | Status: AC | PRN

## 2024-05-07 MED ORDER — OXYCODONE HCL 5 MG PO TABS: 5.0000 mg | ORAL_TABLET | Freq: Two times a day (BID) | ORAL | Status: DC | PRN

## 2024-05-07 MED ORDER — POTASSIUM CHLORIDE CRYS ER 20 MEQ PO TBCR: 20.0000 meq | EXTENDED_RELEASE_TABLET | Freq: Two times a day (BID) | ORAL | 0 refills | Status: AC

## 2024-05-07 MED ORDER — GABAPENTIN 100 MG PO CAPS: 300.0000 mg | ORAL_CAPSULE | Freq: Three times a day (TID) | ORAL | 0 refills | Status: AC

## 2024-05-07 MED ORDER — NICOTINE 21 MG/24HR TD PT24: 21.0000 mg | MEDICATED_PATCH | TRANSDERMAL | 0 refills | Status: AC

## 2024-05-07 MED ORDER — VARENICLINE TARTRATE (STARTER) 0.5 MG X 11 & 1 MG X 42 PO TBPK: ORAL_TABLET | ORAL | 2 refills | Status: AC

## 2024-05-07 MED ORDER — MAGNESIUM CITRATE 200 MG PO TABS: 200.0000 mg | ORAL_TABLET | Freq: Two times a day (BID) | ORAL | 0 refills | Status: AC

## 2024-05-07 NOTE — Plan of Care (Signed)

## 2024-05-07 NOTE — Plan of Care (Signed)
  Problem: Clinical Measurements: Goal: Diagnostic test results will improve Outcome: Progressing   Problem: Activity: Goal: Risk for activity intolerance will decrease Outcome: Adequate for Discharge   Problem: Elimination: Goal: Will not experience complications related to bowel motility Outcome: Progressing

## 2024-05-07 NOTE — Plan of Care (Signed)
  Problem: Education: Goal: Knowledge of General Education information will improve Description: Including pain rating scale, medication(s)/side effects and non-pharmacologic comfort measures 05/07/2024 1209 by Burnard Almarie BROCKS, RN Outcome: Adequate for Discharge 05/07/2024 0711 by Burnard Almarie BROCKS, RN Outcome: Progressing   Problem: Health Behavior/Discharge Planning: Goal: Ability to manage health-related needs will improve 05/07/2024 1209 by Burnard Almarie BROCKS, RN Outcome: Adequate for Discharge 05/07/2024 0711 by Burnard Almarie BROCKS, RN Outcome: Progressing   Problem: Clinical Measurements: Goal: Ability to maintain clinical measurements within normal limits will improve 05/07/2024 1209 by Burnard Almarie BROCKS, RN Outcome: Adequate for Discharge 05/07/2024 0711 by Burnard Almarie BROCKS, RN Outcome: Progressing Goal: Will remain free from infection 05/07/2024 1209 by Burnard Almarie BROCKS, RN Outcome: Adequate for Discharge 05/07/2024 0711 by Burnard Almarie BROCKS, RN Outcome: Progressing Goal: Diagnostic test results will improve 05/07/2024 1209 by Burnard Almarie BROCKS, RN Outcome: Adequate for Discharge 05/07/2024 0711 by Burnard Almarie BROCKS, RN Outcome: Progressing Goal: Respiratory complications will improve 05/07/2024 1209 by Burnard Almarie BROCKS, RN Outcome: Adequate for Discharge 05/07/2024 0711 by Burnard Almarie BROCKS, RN Outcome: Progressing Goal: Cardiovascular complication will be avoided 05/07/2024 1209 by Burnard Almarie BROCKS, RN Outcome: Adequate for Discharge 05/07/2024 0711 by Burnard Almarie BROCKS, RN Outcome: Progressing   Problem: Activity: Goal: Risk for activity intolerance will decrease 05/07/2024 1209 by Burnard Almarie BROCKS, RN Outcome: Adequate for Discharge 05/07/2024 9288 by Burnard Almarie BROCKS, RN Outcome: Progressing   Problem: Nutrition: Goal: Adequate nutrition will be maintained 05/07/2024 1209 by Burnard Almarie BROCKS, RN Outcome: Adequate for  Discharge 05/07/2024 0711 by Burnard Almarie BROCKS, RN Outcome: Progressing   Problem: Coping: Goal: Level of anxiety will decrease 05/07/2024 1209 by Burnard Almarie BROCKS, RN Outcome: Adequate for Discharge 05/07/2024 0711 by Burnard Almarie BROCKS, RN Outcome: Progressing   Problem: Elimination: Goal: Will not experience complications related to bowel motility 05/07/2024 1209 by Burnard Almarie BROCKS, RN Outcome: Adequate for Discharge 05/07/2024 0711 by Burnard Almarie BROCKS, RN Outcome: Progressing Goal: Will not experience complications related to urinary retention 05/07/2024 1209 by Burnard Almarie BROCKS, RN Outcome: Adequate for Discharge 05/07/2024 0711 by Burnard Almarie BROCKS, RN Outcome: Progressing   Problem: Pain Managment: Goal: General experience of comfort will improve and/or be controlled 05/07/2024 1209 by Burnard Almarie BROCKS, RN Outcome: Adequate for Discharge 05/07/2024 0711 by Burnard Almarie BROCKS, RN Outcome: Progressing   Problem: Safety: Goal: Ability to remain free from injury will improve 05/07/2024 1209 by Burnard Almarie BROCKS, RN Outcome: Adequate for Discharge 05/07/2024 0711 by Burnard Almarie BROCKS, RN Outcome: Progressing   Problem: Skin Integrity: Goal: Risk for impaired skin integrity will decrease 05/07/2024 1209 by Burnard Almarie BROCKS, RN Outcome: Adequate for Discharge 05/07/2024 0711 by Burnard Almarie BROCKS, RN Outcome: Progressing

## 2024-05-07 NOTE — Discharge Summary (Signed)
 Name: Evan West MRN: [id number] DOB: [birth year] 48 y.o. PCP: Ezzard Staci SAILOR, NP  Date of Admission: 05/06/2024  9:09 AM Date of Discharge: 05/07/2024  Attending Physician: Dr. Jone Dauphin  Discharge Diagnosis: Principal Problem:   Acute pancreatitis Alcohol use disorder, in remission Tobacco use disorder Hypokalemia Hypomagnesemia Hyponatremia  Discharge Medications: Allergies as of 05/07/2024   No Known Allergies      Medication List     TAKE these medications    gabapentin  100 MG capsule Commonly known as: Neurontin  Take 3 capsules (300 mg total) by mouth 3 (three) times daily.   Magnesium  Citrate 200 MG Tabs Take 200 mg by mouth 2 (two) times daily for 7 days.   nicotine  21 mg/24hr patch Commonly known as: NICODERM CQ  - dosed in mg/24 hours Place 1 patch (21 mg total) onto the skin daily.   oxyCODONE  5 MG immediate release tablet Commonly known as: Roxicodone  Take 1 tablet (5 mg total) by mouth every 6 (six) hours as needed for severe pain (pain score 7-10).   potassium chloride  SA 20 MEQ tablet Commonly known as: KLOR-CON  M Take 1 tablet (20 mEq total) by mouth 2 (two) times daily.   traMADol  50 MG tablet Commonly known as: Ultram  Take 1 tablet (50 mg total) by mouth every 6 (six) hours as needed for up to 3 days.   Varenicline  Tartrate (Starter) 0.5 MG X 11 & 1 MG X 42 Tbpk Take 0.5 mg by mouth daily for 3 days, THEN 0.5 mg 2 (two) times daily for 4 days, THEN 1 mg 2 (two) times daily. Start taking on: May 07, 2024        Disposition and follow-up:   Evan West was discharged from [facility] in Good condition.  At the hospital follow up visit please address:  1.  Follow-up:   a. 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  May need further titration of pain regimen.  Should abstain from further alcohol and tobacco  use. Was discharged on gabapentin  and short supply of tramadol .     b. Hypomagnesemia/Hypokalemia - hypokalemia likely due to GI loss and hypomagnesemia.  He was given 7-day supply of supplements.   c. Hyponatremia - likely due to hypovolemia, reasonable recheck BMP for resolution of sodium abnormality.   d.  Tobacco use disorder - please follow-up with patient on smoking status.  He was discharged with Chantix  starter pack, nicotine  replacement therapy (patches).  Think this may be contributing to recurrent pancreatitis.  2.  Labs / imaging needed at time of follow-up: BMP  3.  Pending labs/ test needing follow-up: None   Follow-up Appointments:   Hospital Course by problem list: #Acute on chronic pancreatitis with pseudocyst Patient with recurrent pancreatitis initially due to AUD presenting with similar abdominal pain.  Has been seen in the hospital several times over the past 5 and 6 months for the same problem.  Has not had any pain-free stretches since first diagnosis but does note continued smoking.  Initially tender to palpation on exam which improved.  Lipase was initially elevated at 262.  RUQ US  negative for biliary pathology.  Triglycerides of 77 6 months ago.  Not likely medication induced as patient has not been taking any other home medications.  Discussed case with GI who did not recommend further intervention of pseudocyst due to small size despite interval increase.  The patient's pain was  controlled with acetaminophen  and oxycodone  10 mg.  He did not require IV pain medication.  He received IVF during his stay.  Advanced his diet as tolerated.  He does have outpatient follow-up with [facility] on 05/29/2024.  Will likely need to see pain specialist for continued management of chronic pain in the outpatient setting.  Was discharged on 3-day supply of tramadol  and 30-day supply of gabapentin .   #Alcohol use disorder, in remission Patient states he was heavily drinking daily  before first episode of pancreatitis.  Has abstained from alcohol use since first admission.  Denies ever experiencing tremors after stopping but does report still having cravings for alcohol.  Was placed on CIWA protocol without Ativan .  No signs of withdrawal symptoms.  Given thiamine , folate, multivitamin supplementation.  May benefit from naltrexone for cravings in the outpatient setting.  #Tobacco use disorder Patient states that he continues to smoke.  Is willing to try quitting.  Think this may be contributing to recurrent pancreatitis.  Discharging him with Chantix  starter pack and 30-day supply of nicotine  patches.   #Hyponatremia Mildly low Na of 133 upon admission which improved with IV fluids. Likely due to hypovolemia in setting of poor PO intake from pancreatitis.   #Hypokalemia #Hypomagnesemia Initial potassium of 3.4 which did not change with 40 mEq repletion.  He was given additional 40 mEq twice daily repletion.  Magnesium  was low-normal at 1.7.  Likely in the setting of GI loss from vomiting as well as hypomagnesemia.  Discharged with 7 day supply of supplementation.   Discharge Subjective: Evan West reports that his pain is better controlled with oxycodone .  He is requesting pain medications upon discharge.  Tolerating oral intake without problem.  Ate breakfast.  States that he had a bowel movement since arrival.  Thinks he may be having a sore throat and getting a cold.  Denies fever/chills or shortness of breath. Patient is medically ready for discharge.  Discharge Exam:   BP (!) 140/66 (BP Location: Right Arm)   Pulse 78   Temp 98.2 F (36.8 C)   Resp 16   Ht 5' 8 (1.727 m)   Wt 83.9 kg   SpO2 100%   BMI 28.13 kg/m  Physical Exam: Constitutional: well-appearing, well-nourished, in no acute distress HENT: normocephalic atraumatic, mucous membranes moist Eyes: conjunctiva non-erythematous, PERRL, no scleral icterus Neck: supple without lesions,  thyroid non-enlarged and non-tender Cardiovascular: regular rate and rhythm, no m/r/g Pulmonary/Chest: normal work of breathing on room air, lungs CTAB Abdominal: soft, non-distended, periumbilical area mildly tender to palpation (improved from yesterday) MSK: normal bulk and tone Neurological: alert & oriented x3 Skin: warm and dry Extremities: no edema or cyanosis; peripheral pulses intact Psych: normal mood and affect, thought content normal   Pertinent Labs, Studies, and Procedures:     Latest Ref Rng & Units 05/07/2024    2:15 AM 05/06/2024    4:01 PM 05/06/2024    9:23 AM  CBC  WBC 4.0 - 10.5 K/uL 10.4  13.2  11.1   Hemoglobin 13.0 - 17.0 g/dL 88.6  86.3  85.6   Hematocrit 39.0 - 52.0 % 33.8  40.0  41.9   Platelets 150 - 400 K/uL 150  194  205        Latest Ref Rng & Units 05/07/2024    2:15 AM 05/06/2024    4:01 PM 05/06/2024    9:23 AM  CMP  Glucose 70 - 99 mg/dL 897   856  BUN 6 - 20 mg/dL 7   7   Creatinine 9.38 - 1.24 mg/dL 9.24  9.23  9.09   Sodium 135 - 145 mmol/L 135   132   Potassium 3.5 - 5.1 mmol/L 3.4   3.4   Chloride 98 - 111 mmol/L 100   95   CO2 22 - 32 mmol/L 25   25   Calcium 8.9 - 10.3 mg/dL 8.4   9.0   Total Protein 6.5 - 8.1 g/dL   8.0   Total Bilirubin 0.0 - 1.2 mg/dL   1.4   Alkaline Phos 38 - 126 U/L   99   AST 15 - 41 U/L   25   ALT 0 - 44 U/L   26     US  Abdomen Limited RUQ (LIVER/GB) Result Date: 05/06/2024 EXAM: Right Upper Quadrant Abdominal Ultrasound 05/06/2024 03:06:58 PM TECHNIQUE: Real-time ultrasonography of the right upper quadrant of the abdomen was performed. COMPARISON: None available. CLINICAL HISTORY: Acute pancreatitis. FINDINGS: LIVER: Diffusely increased liver parenchymal echotexture suggesting fatty infiltration. No intrahepatic biliary ductal dilatation. No evidence of mass. BILIARY SYSTEM: No pericholecystic fluid or wall thickening. No cholelithiasis. Common bile duct measures 4.7 mm. RIGHT KIDNEY: The right kidney  is grossly unremarkable in appearances without evidence of hydronephrosis, echogenic calculi or worrisome mass lesions. PANCREAS: Incomplete visualization of the pancreas. Poorly defined cystic structure demonstrated adjacent to the uncinate process measuring 2.8 cm in diameter. This likely corresponds to the pseudocyst seen on prior CT. OTHER: No right upper quadrant ascites. IMPRESSION: 1. Poorly defined cystic structure adjacent to the uncinate process, likely corresponding to a pseudocyst. 2. No evidence of acute cholecystitis or cholelithiasis. Electronically signed by: Elsie Gravely MD 05/06/2024 03:11 PM EST RP Workstation: [id number]   CT ABDOMEN PELVIS W CONTRAST Result Date: 05/06/2024 CLINICAL DATA:  Abdominal pain.  Pancreatitis. EXAM: CT ABDOMEN AND PELVIS WITH CONTRAST TECHNIQUE: Multidetector CT imaging of the abdomen and pelvis was performed using the standard protocol following bolus administration of intravenous contrast. RADIATION DOSE REDUCTION: This exam was performed according to the departmental dose-optimization program which includes automated exposure control, adjustment of the mA and/or kV according to patient size and/or use of iterative reconstruction technique. CONTRAST:  75mL OMNIPAQUE  IOHEXOL  350 MG/ML SOLN COMPARISON:  CT abdomen pelvis dated 04/24/2024. FINDINGS: Lower chest: The visualized lung bases are clear. No intra-abdominal free air or free fluid. Hepatobiliary: Fatty liver. No biliary dilatation. The gallbladder is unremarkable. Pancreas: There is inflammatory changes predominantly involving the head and uncinate process of the pancreas in keeping with known acute pancreatitis. Relatively similar size of the pseudocyst in the uncinate process of the pancreas. Interval increase in the size of a pseudocyst along the medial second portion of the duodenum measuring approximately 2.2 x 1.9 cm (previously 1.5 x 1.1 cm). Spleen: Normal in size without focal abnormality.  Adrenals/Urinary Tract: The adrenal glands are unremarkable. There is no hydronephrosis on either side. There is symmetric enhancement and excretion of contrast by both kidneys. The visualized ureters and urinary bladder appear unremarkable Stomach/Bowel: Inflammatory changes of the second portion of the duodenum secondary to acute pancreatitis. There is no bowel obstruction. The appendix is normal. Vascular/Lymphatic: Mild aortoiliac atherosclerotic disease. The IVC is unremarkable. The splenic vein, SMV, and main portal vein are patent. No portal venous gas. No adenopathy. Reproductive: The prostate and seminal vesicles are grossly unremarkable. Other: None Musculoskeletal: Degenerative changes of the lower lumbar spine. No acute osseous pathology. IMPRESSION: 1. Acute pancreatitis with interval increase  in the size of a pseudocyst along the medial second portion of the duodenum. 2. Fatty liver. 3. No bowel obstruction. Normal appendix. 4.  Aortic Atherosclerosis (ICD10-I70.0). Electronically Signed   By: Vanetta Chou M.D.   On: 05/06/2024 12:52     Discharge Instructions:   Discharge Instructions      To [place] or their caretakers,  You were recently admitted to [facility] for pancreatitis.  It appears that you have a cyst on your pancreas that has increased in size, however our gastroenterology team does not believe that you would benefit from drainage of the cyst.  We have prescribed you medication for pain including gabapentin  and tramadol .  Please make sure that you attend your appointment with gastroenterology (GI) in December.  We have also prescribed you nicotine  patches and Chantix  to help you quit smoking.  Continued smoking may be causing you to have recurrent pancreatitis episodes.  Please try your best to refrain from smoking.  We also found that your potassium level was slightly low so we repleted you with potassium and magnesium  supplementation.  Continue  taking your home medications with the following changes:  Start taking Gabapentin  100 mg (take 3 tablets 3 times a day) Tramadol  50 mg every 6 hours as needed for pain (total of 3 days) Varenicline  Tartrate (Chantix )- take 0.5 mg once daily for 3 days, then 0.5 mg 2 times daily for 4 days, then 1 mg 2 times daily Nicotine  patch 21 mg daily Magnesium  citrate 200 mg twice daily for 7 days Potassium chloride  20 mEq - take 1 tablet by mouth 2 times daily for 7 days   You should seek further medical care if you experience uncontrollable nausea/vomiting with worse abdominal pain and fevers/chills.  Please follow up with the following doctors/specialties: Gastroenterology - you have an appointment with  GI on December 15 Chronic pain specialist  We recommend that you also see your primary care doctor in about a week to make sure that you continue to improve. We are so glad that you are feeling better.  Sincerely,  [facility]        Signed:  Letha Cheadle, MD Internal Medicine Resident, PGY-1 05/07/2024, 12:28 PM Please contact the on call pager after 5 pm and on weekends at [phone].

## 2024-05-07 NOTE — Discharge Instructions (Addendum)
 To Lamar Emilio Mages or their caretakers,  You were recently admitted to [facility] for pancreatitis.  It appears that you have a cyst on your pancreas that has increased in size, however our gastroenterology team does not believe that you would benefit from drainage of the cyst.  We have prescribed you medication for pain including gabapentin  and tramadol .  Please make sure that you attend your appointment with gastroenterology (GI) in December.  We have also prescribed you nicotine  patches and Chantix  to help you quit smoking.  Continued smoking may be causing you to have recurrent pancreatitis episodes.  Please try your best to refrain from smoking.  We also found that your potassium level was slightly low so we repleted you with potassium and magnesium  supplementation.  Continue taking your home medications with the following changes:  Start taking Gabapentin  100 mg (take 3 tablets 3 times a day) Tramadol  50 mg every 6 hours as needed for pain (total of 3 days) Varenicline  Tartrate (Chantix )- take 0.5 mg once daily for 3 days, then 0.5 mg 2 times daily for 4 days, then 1 mg 2 times daily Nicotine  patch 21 mg daily Magnesium  citrate 200 mg twice daily for 7 days Potassium chloride  20 mEq - take 1 tablet by mouth 2 times daily for 7 days   You should seek further medical care if you experience uncontrollable nausea/vomiting with worse abdominal pain and fevers/chills.  Please follow up with the following doctors/specialties: Gastroenterology - you have an appointment with [facility] on December 15 Chronic pain specialist  We recommend that you also see your primary care doctor in about a week to make sure that you continue to improve. We are so glad that you are feeling better.  Sincerely,  [facility]

## 2024-05-29 ENCOUNTER — Ambulatory Visit: Payer: PRIVATE HEALTH INSURANCE | Admitting: Physician Assistant

## 2024-05-29 NOTE — Progress Notes (Deleted)
 05/29/2024 Evan West [id number] 11-10-1975  Referring provider: Ezzard Staci SAILOR, NP Primary GI doctor: Dr. 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 male with a past medical history listed below presents for evaluation of ***.   Was seen inpatient by our team on 10/12/2023 when he was hospitilized for AB pain with nausea and vomiting, found to have necrotizing pancreatitis, treated conservatively. Since that hospitlization he has had 2 ER visits for the same and 2 admissions, last one being 05/06/2024 secondary to acute on chronic pancreatitis, pseudocyst and electolyte abnormalities.  *** Discussed the use of AI scribe software for clinical note transcription with the patient, who gave verbal consent to proceed.  History of Present Illness            He  reports that he has been smoking cigarettes. He has a 3.6 pack-year smoking history. He does not have any smokeless tobacco history on file. He reports current alcohol use. He reports that he does not use drugs.  RELEVANT GI HISTORY, IMAGING AND LABS: Results         05/06/2024 CT AB W Pancreas: There is inflammatory changes predominantly involving the head and uncinate process of the pancreas in keeping with known acute pancreatitis. Relatively similar size of the pseudocyst in the uncinate process of the pancreas. Interval increase in the size of a pseudocyst along the medial  second portion of the duodenum measuring approximately 2.2 x 1.9 cm (previously 1.5 x 1.1 cm). CBC    Component Value Date/Time   WBC 10.4 05/07/2024 0215   RBC 3.44 (L) 05/07/2024 0215   HGB 11.3 (L) 05/07/2024 0215   HCT 33.8 (L) 05/07/2024 0215   PLT 150 05/07/2024 0215   MCV 98.3 05/07/2024 0215   MCH 32.8 05/07/2024 0215   MCHC 33.4 05/07/2024 0215   RDW 14.4 05/07/2024 0215   LYMPHSABS 3.7 01/20/2024 1200   MONOABS 1.1 (H) 01/20/2024 1200   EOSABS 0.4 01/20/2024 1200   BASOSABS 0.1 01/20/2024 1200   Recent Labs    10/14/23 0505 10/15/23 0523 10/16/23 0452 10/31/23 1040 01/20/24 1200 01/21/24 0442 04/23/24 2135 05/06/24 0923 05/06/24 1601 05/07/24 0215  HGB 13.0 12.8* 13.4 14.1 14.0 12.7* 14.2 14.3 13.6 11.3*    CMP     Component Value Date/Time   NA 135 05/07/2024 0215   K 3.4 (L) 05/07/2024 0215   CL 100 05/07/2024 0215   CO2 25 05/07/2024 0215   GLUCOSE 102 (H) 05/07/2024 0215   BUN 7 05/07/2024 0215   CREATININE 0.75 05/07/2024 0215   CALCIUM 8.4 (L) 05/07/2024 0215   PROT 8.0 05/06/2024 0923   ALBUMIN 3.7 05/06/2024 0923   AST 25 05/06/2024 0923   ALT 26 05/06/2024 0923   ALKPHOS 99 05/06/2024 0923   BILITOT 1.4 (H) 05/06/2024 0923   GFRNONAA >60 05/07/2024 0215      Latest Ref Rng & Units 05/06/2024    9:23 AM 04/23/2024    9:35 PM 01/21/2024    4:42 AM  Hepatic Function  Total Protein 6.5 - 8.1 g/dL 8.0  7.3  6.4   Albumin 3.5 - 5.0 g/dL 3.7  3.4  2.9   AST 15 - 41 U/L 25  18  18    ALT 0 - 44 U/L 26  20  20    Alk Phosphatase 38 - 126 U/L 99  70  71   Total Bilirubin 0.0 - 1.2 mg/dL 1.4  0.4  0.8       Current Medications:   Current Outpatient Medications (Analgesics):    oxyCODONE  (ROXICODONE ) 5 MG immediate release tablet, Take 1 tablet (5 mg total) by mouth every 6 (six) hours as needed for severe pain (pain score 7-10).  Current Outpatient Medications (Other):    gabapentin  (NEURONTIN ) 100 MG capsule, Take 3 capsules (300 mg total)  by mouth 3 (three) times daily.   nicotine  (NICODERM CQ  - DOSED IN MG/24 HOURS) 21 mg/24hr patch, Place 1 patch (21 mg total) onto the skin daily.   potassium chloride  SA (KLOR-CON  M) 20 MEQ tablet, Take 1 tablet (20 mEq total) by mouth 2 (two) times daily.   Varenicline  Tartrate, Starter, 0.5 MG X 11 & 1 MG X 42 TBPK, Take 0.5 mg by mouth daily for 3 days, THEN 0.5 mg 2 (two) times daily for 4 days, THEN 1 mg 2 (two) times daily.  Medical History:  Past Medical History:  Diagnosis Date   Necrotizing pancreatitis    Pancreatitis    Allergies: Allergies[1]   Surgical History:  He  has a past surgical history that includes No past surgeries. Family History:  His family history includes Diabetes in an other family member.  REVIEW OF SYSTEMS  : All other systems reviewed and negative except where noted in the History of Present Illness.  PHYSICAL EXAM: There were no vitals taken for this visit. Physical Exam          [name], PA-C 5:15 AM      [1] No Known Allergies
